# Patient Record
Sex: Female | Born: 1975 | Race: Black or African American | Hispanic: No | Marital: Married | Smoking: Never smoker
Health system: Southern US, Community
[De-identification: ages and names within clinical notes are randomized; demographics above are authoritative.]

## PROBLEM LIST (undated history)

## (undated) DIAGNOSIS — Z789 Other specified health status: Secondary | ICD-10-CM

## (undated) HISTORY — PX: NO PAST SURGERIES: SHX2092

## (undated) HISTORY — DX: Other specified health status: Z78.9

---

## 2015-04-04 ENCOUNTER — Other Ambulatory Visit: Payer: Self-pay

## 2015-04-04 DIAGNOSIS — Z3482 Encounter for supervision of other normal pregnancy, second trimester: Secondary | ICD-10-CM

## 2015-04-04 NOTE — Progress Notes (Signed)
NEW OB LABS DONE TODAY Lynn Lawrence 

## 2015-04-04 NOTE — Progress Notes (Signed)
Patient unable to void at today's lab visit so an OB urine culture was not collected, it will need to be done at her next OB visit.Lynn Lawrence, Rodena Medinobert Lee

## 2015-04-05 LAB — OBSTETRIC PANEL
Antibody Screen: NEGATIVE
Basophils Absolute: 0 10*3/uL (ref 0.0–0.1)
Basophils Relative: 0 % (ref 0–1)
EOS PCT: 1 % (ref 0–5)
Eosinophils Absolute: 0.1 10*3/uL (ref 0.0–0.7)
HCT: 33.9 % — ABNORMAL LOW (ref 36.0–46.0)
Hemoglobin: 10.8 g/dL — ABNORMAL LOW (ref 12.0–15.0)
Hepatitis B Surface Ag: NEGATIVE
LYMPHS PCT: 27 % (ref 12–46)
Lymphs Abs: 1.6 10*3/uL (ref 0.7–4.0)
MCH: 23.5 pg — AB (ref 26.0–34.0)
MCHC: 31.9 g/dL (ref 30.0–36.0)
MCV: 73.9 fL — ABNORMAL LOW (ref 78.0–100.0)
MONO ABS: 0.5 10*3/uL (ref 0.1–1.0)
Monocytes Relative: 9 % (ref 3–12)
Neutro Abs: 3.7 10*3/uL (ref 1.7–7.7)
Neutrophils Relative %: 63 % (ref 43–77)
Platelets: 191 10*3/uL (ref 150–400)
RBC: 4.59 MIL/uL (ref 3.87–5.11)
RDW: 16.8 % — ABNORMAL HIGH (ref 11.5–15.5)
Rh Type: POSITIVE
Rubella: 5.34 Index — ABNORMAL HIGH (ref <0.90)
WBC: 5.9 10*3/uL (ref 4.0–10.5)

## 2015-04-05 LAB — HIV ANTIBODY (ROUTINE TESTING W REFLEX): HIV 1&2 Ab, 4th Generation: NONREACTIVE

## 2015-04-05 LAB — SICKLE CELL SCREEN: Sickle Cell Screen: NEGATIVE

## 2015-04-11 ENCOUNTER — Encounter: Payer: Self-pay | Admitting: Family Medicine

## 2015-04-11 ENCOUNTER — Inpatient Hospital Stay (HOSPITAL_COMMUNITY): Admission: RE | Admit: 2015-04-11 | Payer: Self-pay | Source: Ambulatory Visit

## 2015-04-11 ENCOUNTER — Ambulatory Visit (INDEPENDENT_AMBULATORY_CARE_PROVIDER_SITE_OTHER): Payer: Self-pay | Admitting: Family Medicine

## 2015-04-11 VITALS — BP 101/70 | HR 95 | Temp 98.4°F | Wt 153.4 lb

## 2015-04-11 DIAGNOSIS — Z3491 Encounter for supervision of normal pregnancy, unspecified, first trimester: Secondary | ICD-10-CM

## 2015-04-11 DIAGNOSIS — Z331 Pregnant state, incidental: Secondary | ICD-10-CM

## 2015-04-11 LAB — POCT URINALYSIS DIPSTICK
Bilirubin, UA: NEGATIVE
Blood, UA: NEGATIVE
GLUCOSE UA: NEGATIVE
KETONES UA: NEGATIVE
Nitrite, UA: NEGATIVE
PH UA: 7
Protein, UA: NEGATIVE
Spec Grav, UA: 1.025
Urobilinogen, UA: 0.2

## 2015-04-11 LAB — OB RESULTS CONSOLE GC/CHLAMYDIA
CHLAMYDIA, DNA PROBE: NEGATIVE
Gonorrhea: NEGATIVE

## 2015-04-11 NOTE — Progress Notes (Signed)
Lynn Lawrence is a 4 G2P2012 presenting today for first OB visit. Recent immigrant from [place], speaks [place]. Reports last menstrual period in March-April 2016 but unsure exact date. Went to [facility] with positive pregnancy test at the end of June 2016. States they tested her urine and estimated date of delivery is in January 2017. Agrees to sign form so records from health department can be obtained.  Reports history of two vaginal deliveries at term without complication. Has two children, ages 36 and 17yo. Has had 7 total pregnancies. Five planned terminations. Two children were born in [place]. Reports no significant past medical history. Denies any significant family genetic history. Has never had pap smear before. Lives with her aunt and two children. No pets in home. No smokers in home. Reports occasional social drinking prior to pregnancy, but denies alcohol use while pregnant. Noted morning sickness at beginning of pregnancy, however this has improved. Has been taking PNV. No history of ultrasound. Not planned pregnancy, but is happy she is pregnant and has good relationship with father.  Previous labs reviewed and discussed.  Will obtain pap smear and gonorrhea/chlamydia screen today. Will obtain urinalysis and urine culture. Sign release to obtain records from health department. Will refer to high risk clinic for at least one visit due to advanced maternal age. Will obtain ultrasound for dating. Genetic counseling given advanced maternal age. Encouraged continued use of prenatal vitamins. Counseled on birth control--states she would like birth control after delivery, but unsure which form. Follow up in 4 weeks or sooner pending [place] results.

## 2015-04-11 NOTE — Patient Instructions (Signed)
First Trimester of Pregnancy The first trimester of pregnancy is from week 1 until the end of week 12 (months 1 through 3). A week after a sperm fertilizes an egg, the egg will implant on the wall of the uterus. This embryo will begin to develop into a baby. Genes from you and your partner are forming the baby. The female genes determine whether the baby is a boy or a girl. At 6-8 weeks, the eyes and face are formed, and the heartbeat can be seen on ultrasound. At the end of 12 weeks, all the baby's organs are formed.  Now that you are pregnant, you will want to do everything you can to have a healthy baby. Two of the most important things are to get good prenatal care and to follow your health care provider's instructions. Prenatal care is all the medical care you receive before the baby's birth. This care will help prevent, find, and treat any problems during the pregnancy and childbirth. BODY CHANGES Your body goes through many changes during pregnancy. The changes vary from woman to woman.   You may gain or lose a couple of pounds at first.  You may feel sick to your stomach (nauseous) and throw up (vomit). If the vomiting is uncontrollable, call your health care provider.  You may tire easily.  You may develop headaches that can be relieved by medicines approved by your health care provider.  You may urinate more often. Painful urination may mean you have a bladder infection.  You may develop heartburn as a result of your pregnancy.  You may develop constipation because certain hormones are causing the muscles that push waste through your intestines to slow down.  You may develop hemorrhoids or swollen, bulging veins (varicose veins).  Your breasts may begin to grow larger and become tender. Your nipples may stick out more, and the tissue that surrounds them (areola) may become darker.  Your gums may bleed and may be sensitive to brushing and flossing.  Dark spots or blotches (chloasma,  mask of pregnancy) may develop on your face. This will likely fade after the baby is born.  Your menstrual periods will stop.  You may have a loss of appetite.  You may develop cravings for certain kinds of food.  You may have changes in your emotions from day to day, such as being excited to be pregnant or being concerned that something may go wrong with the pregnancy and baby.  You may have more vivid and strange dreams.  You may have changes in your hair. These can include thickening of your hair, rapid growth, and changes in texture. Some women also have hair loss during or after pregnancy, or hair that feels dry or thin. Your hair will most likely return to normal after your baby is born. WHAT TO EXPECT AT YOUR PRENATAL VISITS During a routine prenatal visit:  You will be weighed to make sure you and the baby are growing normally.  Your blood pressure will be taken.  Your abdomen will be measured to track your baby's growth.  The fetal heartbeat will be listened to starting around week 10 or 12 of your pregnancy.  Test results from any previous visits will be discussed. Your health care provider may ask you:  How you are feeling.  If you are feeling the baby move.  If you have had any abnormal symptoms, such as leaking fluid, bleeding, severe headaches, or abdominal cramping.  If you have any questions. Other tests   that may be performed during your first trimester include:  Blood tests to find your blood type and to check for the presence of any previous infections. They will also be used to check for low iron levels (anemia) and Rh antibodies. Later in the pregnancy, blood tests for diabetes will be done along with other tests if problems develop.  Urine tests to check for infections, diabetes, or protein in the urine.  An ultrasound to confirm the proper growth and development of the baby.  An amniocentesis to check for possible genetic problems.  Fetal screens for  spina bifida and Down syndrome.  You may need other tests to make sure you and the baby are doing well. HOME CARE INSTRUCTIONS  Medicines  Follow your health care provider's instructions regarding medicine use. Specific medicines may be either safe or unsafe to take during pregnancy.  Take your prenatal vitamins as directed.  If you develop constipation, try taking a stool softener if your health care provider approves. Diet  Eat regular, well-balanced meals. Choose a variety of foods, such as meat or vegetable-based protein, fish, milk and low-fat dairy products, vegetables, fruits, and whole grain breads and cereals. Your health care provider will help you determine the amount of weight gain that is right for you.  Avoid raw meat and uncooked cheese. These carry germs that can cause birth defects in the baby.  Eating four or five small meals rather than three large meals a day may help relieve nausea and vomiting. If you start to feel nauseous, eating a few soda crackers can be helpful. Drinking liquids between meals instead of during meals also seems to help nausea and vomiting.  If you develop constipation, eat more high-fiber foods, such as fresh vegetables or fruit and whole grains. Drink enough fluids to keep your urine clear or pale yellow. Activity and Exercise  Exercise only as directed by your health care provider. Exercising will help you:  Control your weight.  Stay in shape.  Be prepared for labor and delivery.  Experiencing pain or cramping in the lower abdomen or low back is a good sign that you should stop exercising. Check with your health care provider before continuing normal exercises.  Try to avoid standing for long periods of time. Move your legs often if you must stand in one place for a long time.  Avoid heavy lifting.  Wear low-heeled shoes, and practice good posture.  You may continue to have sex unless your health care provider directs you  otherwise. Relief of Pain or Discomfort  Wear a good support bra for breast tenderness.   Take warm sitz baths to soothe any pain or discomfort caused by hemorrhoids. Use hemorrhoid cream if your health care provider approves.   Rest with your legs elevated if you have leg cramps or low back pain.  If you develop varicose veins in your legs, wear support hose. Elevate your feet for 15 minutes, 3-4 times a day. Limit salt in your diet. Prenatal Care  Schedule your prenatal visits by the twelfth week of pregnancy. They are usually scheduled monthly at first, then more often in the last 2 months before delivery.  Write down your questions. Take them to your prenatal visits.  Keep all your prenatal visits as directed by your health care provider. Safety  Wear your seat belt at all times when driving.  Make a list of emergency phone numbers, including numbers for family, friends, the hospital, and police and fire departments. General Tips    Ask your health care provider for a referral to a local prenatal education class. Begin classes no later than at the beginning of month 6 of your pregnancy.  Ask for help if you have counseling or nutritional needs during pregnancy. Your health care provider can offer advice or refer you to specialists for help with various needs.  Do not use hot tubs, steam rooms, or saunas.  Do not douche or use tampons or scented sanitary pads.  Do not cross your legs for long periods of time.  Avoid cat litter boxes and soil used by cats. These carry germs that can cause birth defects in the baby and possibly loss of the fetus by miscarriage or stillbirth.  Avoid all smoking, herbs, alcohol, and medicines not prescribed by your health care provider. Chemicals in these affect the formation and growth of the baby.  Schedule a dentist appointment. At home, brush your teeth with a soft toothbrush and be gentle when you floss. SEEK MEDICAL CARE IF:   You have  dizziness.  You have mild pelvic cramps, pelvic pressure, or nagging pain in the abdominal area.  You have persistent nausea, vomiting, or diarrhea.  You have a bad smelling vaginal discharge.  You have pain with urination.  You notice increased swelling in your face, hands, legs, or ankles. SEEK IMMEDIATE MEDICAL CARE IF:   You have a fever.  You are leaking fluid from your vagina.  You have spotting or bleeding from your vagina.  You have severe abdominal cramping or pain.  You have rapid weight gain or loss.  You vomit blood or material that looks like coffee grounds.  You are exposed to German measles and have never had them.  You are exposed to fifth disease or chickenpox.  You develop a severe headache.  You have shortness of breath.  You have any kind of trauma, such as from a fall or a car accident. Document Released: 08/27/2001 Document Revised: 01/17/2014 Document Reviewed: 07/13/2013 ExitCare Patient Information 2015 [employer]. This information is not intended to replace advice given to you by your health care provider. Make sure you discuss any questions you have with your health care provider. Medicines During Pregnancy During pregnancy, there are medicines that are either safe or unsafe to take. Medicines include prescriptions from your caregiver, over-the-counter medicines, topical creams applied to the skin, and all herbal substances. Medicines are put into either Class A, B, C, or D. Class A and B medicines have been shown to be safe in pregnancy. Class C medicines are also considered to be safe in pregnancy, but these medicines should only be used when necessary. Class D medicines should not be used at all in pregnancy. They can be harmful to a baby.  It is best to take as little medicine as possible while pregnant. However, some medicines are necessary to take for the mother and baby's health. Sometimes, it is more dangerous to stop taking certain  medicines than to stay on them. This is often the case for people with long-term (chronic) conditions such as asthma, diabetes, or high blood pressure (hypertension). If you are pregnant and have a chronic illness, call your caregiver right away. Bring a list of your medicines and their doses to your appointments. If you are planning to become pregnant, schedule a doctor's appointment and discuss your medicines with your caregiver. Lastly, write down the phone number to your pharmacist. They can answer questions regarding a medicine's class and safety. They cannot give advice as   to whether you should or should not be on a medicine.  SAFE AND UNSAFE MEDICINES There is a long list of medicines that are considered safe in pregnancy. Below is a shorter list. For specific medicines, ask your caregiver.  AllergyMedicines Loratadine, cetirizine, and chlorpheniramine are safe to take. Certain nasal steroid sprays are safe. Talk to your caregiver about specific brands that are safe. Analgesics Acetaminophen and acetaminophen with codeine are safe to take. All other nonsteroidal anti-inflammatory drugs (NSAIDS) are not safe. This includes ibuprofen.  Antacids Many over-the-counter antacids are safe to take. Talk to your caregiver about specific brands that are safe. Famotidine, ranitidine, and lansoprazole are safe. Omepresole is considered safe to take in the second trimester. Antibiotic Medicines There are several antibiotics to avoid. These include, but are not limited to, tetracyline, quinolones, and sulfa medications. Talk to your caregiver before taking any antibiotic.  Antihistamines Talk to your caregiver about specific brands that are safe.  Asthma Medicines Most asthma steroid inhalers are safe to take. Talk to your caregiver for specific details. Calcium Calcium supplements are safe to take. Do not take oyster shell calcium.  Cough and Cold Medicines It is safe to take products with  guaifenesin or dextromethorphan. Talk to your caregiver about specific brands that are safe. It is not safe to take products that contain aspirin or ibuprofen. Decongestant Medicines Pseudoephedrine-containing products are safe to take in the second and third trimester.  Depression Medicines Talk about these medicines with your caregiver.  Antidiarrheal Medicines It is safe to take loperamide. Talk to your caregiver about specific brands that are safe. It is not safe to take any antidiarrheal medicine that contains bismuth. Eyedrops Allergy eyedrops should be limited.  Iron It is safe to use certain iron-containing medicines for anemia in pregnancy. They require a prescription.  Antinausea Medicines It is safe to take doxylamine and vitamin B6 as directed. There are other prescription medicines available, if needed.  Sleep aids It is safe to take diphenhydramine and acetaminophen with diphenhydramine.  Steroids Hydrocortisone creams are safe to use as directed. Oral steroids require a prescription. It is not safe to take any hemorrhoid cream with pramoxine or phenylephrine. Stool softener It is safe to take stool softener medicines. Avoid daily or prolonged use of stool softeners. Thyroid Medicine It is important to stay on this thyroid medicine. It needs to be followed by your caregiver.  Vaginal Medicines Your caregiver will prescribe a medicine to you if you have a vaginal infection. Certain antifungal medicines are safe to use if you have a sexually transmitted infection (STI). Talk to your caregiver.  Document Released: 09/02/2005 Document Revised: 11/25/2011 Document Reviewed: 09/03/2011 ExitCare Patient Information 2015 [employer]. This information is not intended to replace advice given to you by your health care provider. Make sure you discuss any questions you have with your health care provider.  

## 2015-04-12 ENCOUNTER — Ambulatory Visit: Payer: Self-pay

## 2015-04-12 LAB — URINE CULTURE
Colony Count: NO GROWTH
Organism ID, Bacteria: NO GROWTH

## 2015-04-12 LAB — CERVICOVAGINAL ANCILLARY ONLY
CHLAMYDIA, DNA PROBE: NEGATIVE
Neisseria Gonorrhea: NEGATIVE

## 2015-04-12 LAB — CYTOLOGY - PAP

## 2015-04-19 ENCOUNTER — Ambulatory Visit (HOSPITAL_COMMUNITY)
Admission: RE | Admit: 2015-04-19 | Discharge: 2015-04-19 | Disposition: A | Discharge status: Home / Self Care | Payer: Medicaid Other | Source: Ambulatory Visit | Attending: Family Medicine | Admitting: Family Medicine

## 2015-04-19 ENCOUNTER — Encounter (HOSPITAL_COMMUNITY): Payer: Self-pay

## 2015-04-19 ENCOUNTER — Other Ambulatory Visit: Payer: Self-pay | Admitting: Family Medicine

## 2015-04-19 ENCOUNTER — Other Ambulatory Visit (HOSPITAL_COMMUNITY): Payer: Self-pay | Admitting: Maternal and Fetal Medicine

## 2015-04-19 DIAGNOSIS — IMO0002 Reserved for concepts with insufficient information to code with codable children: Secondary | ICD-10-CM

## 2015-04-19 DIAGNOSIS — Z3491 Encounter for supervision of normal pregnancy, unspecified, first trimester: Secondary | ICD-10-CM

## 2015-04-19 DIAGNOSIS — O09522 Supervision of elderly multigravida, second trimester: Secondary | ICD-10-CM

## 2015-04-19 DIAGNOSIS — Z0489 Encounter for examination and observation for other specified reasons: Secondary | ICD-10-CM

## 2015-04-19 DIAGNOSIS — Z315 Encounter for genetic counseling: Secondary | ICD-10-CM | POA: Insufficient documentation

## 2015-04-19 DIAGNOSIS — Z1389 Encounter for screening for other disorder: Secondary | ICD-10-CM

## 2015-04-19 DIAGNOSIS — Z3A16 16 weeks gestation of pregnancy: Secondary | ICD-10-CM | POA: Diagnosis not present

## 2015-04-20 DIAGNOSIS — O09529 Supervision of elderly multigravida, unspecified trimester: Secondary | ICD-10-CM | POA: Insufficient documentation

## 2015-04-20 LAB — QUAD SCREEN FOR MFM

## 2015-04-20 NOTE — Progress Notes (Addendum)
Genetic Counseling  High-Risk Gestation Note  Appointment Date:  04/19/15 Referred By: Araceli Bouche, DO Date of Birth:  [birth year]    Pregnancy History: [id number] Estimated Date of Delivery: 09/30/15 Estimated Gestational Age: [redacted]w[redacted]d Attending: Alpha Gula, MD  Lynn Lawrence was seen for genetic counseling because of a maternal age of 39 y.o..   Lynn Lawrence brother also attended the genetic counseling appointment today.  In summary:  Reviewed maternal age risks and the associated 1 in 52 risk for fetal aneuploidy  Patient elected to have Quad screening today  She declined NIPS (cost prohibitive)  She declined amniocentesis (concerned re: risk for complications)  Patient elected to have a detailed ultrasound today  There were no markers or anomalies visualized; however the anatomy was not well visualized secondary to the early gestational age  She is scheduled to return to complete the anatomy on 05/18/15  She was counseled regarding maternal age and the association with risk for chromosome conditions due to nondisjunction with aging of the ova.   We reviewed chromosomes, nondisjunction, and the associated 1 in 50 risk for fetal aneuploidy related to a maternal age of 39 y.o. at [redacted]w[redacted]d gestation.  She was counseled that the risk for aneuploidy decreases as gestational age increases, accounting for those pregnancies which spontaneously abort.  We specifically discussed Down syndrome (trisomy 42), trisomies 84 and 17, and sex chromosome aneuploidies (47,XXX and 47,XXY) including the common features and prognoses of each.   We reviewed available screening options including Quad screen, noninvasive prenatal screening (NIPS)/cell free DNA (cfDNA) testing, and detailed ultrasound.  She was counseled that screening tests are used to modify a patient's a priori risk for aneuploidy, typically based on age. This estimate provides a pregnancy specific risk assessment. We reviewed the  benefits and limitations of each option. Specifically, we discussed the conditions for which each test screens, the detection rates, and false positive rates of each. She was also counseled regarding diagnostic testing via amniocentesis. We reviewed the approximate 1 in 300-500 risk for complications for amniocentesis, including spontaneous pregnancy loss. After consideration of all the options, she elected to proceed with Quad screening. Those results will be available in ~3-5 business days.   The patient also expressed interest in having a detailed ultrasound.  A complete ultrasound was performed today. The ultrasound report will be documented separately. There were no visualized fetal anomalies or markers suggestive of aneuploidy; however, the fetal anatomy was not fully visualized due to the early gestational age. The patient is scheduled to return on 05/18/15 for a follow-up ultrasound. Amniocentesis and NIPS were declined today.  She understands that screening tests cannot rule out all birth defects or genetic syndromes. The patient was advised of this limitation and states she still does not want additional testing at this time.   Lynn Lawrence was provided with written information regarding sickle cell anemia (SCA) including the carrier frequency and incidence in the African population, the availability of carrier testing and prenatal diagnosis if indicated.  In addition, we discussed that hemoglobinopathies are routinely screened for as part of the [place] newborn screening panel.  She declined hemoglobin electrophoresis today. 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   Both family histories were reviewed and found to be noncontributory for birth defects, intellectual disability, and known genetic conditions. Without further information regarding the provided family history, an accurate genetic risk cannot be calculated. Further genetic counseling is  warranted if  more information is obtained.  Lynn Lawrence denied exposure to environmental toxins or chemical agents. She denied the use of alcohol, tobacco or street drugs. She denied significant viral illnesses during the course of her pregnancy. Her medical and surgical histories were contributory for 4 elective abortions.   I counseled this patient regarding the above risks and available options.  The approximate face-to-face time with the genetic counselor was 38 minutes.  Donald Prose, MS Certified Genetic Counselor

## 2015-04-21 ENCOUNTER — Telehealth: Payer: Self-pay | Admitting: Family Medicine

## 2015-04-21 NOTE — Telephone Encounter (Signed)
Patient would like to know if she needs to reschedule a month appointment. She'd already had an ultrasound last Wednesday. Please, follow up with Patient.

## 2015-04-21 NOTE — Telephone Encounter (Signed)
She still needs to come in every four weeks for OB visit. Ultrasound visit last week doesn't count, so she is next due for an appointment on 8/27.

## 2015-04-24 NOTE — Telephone Encounter (Signed)
LMOVM for pt to return call. Please inform her of the information below. Sunday Spillers, CMA

## 2015-04-28 ENCOUNTER — Other Ambulatory Visit (HOSPITAL_COMMUNITY): Payer: Self-pay | Admitting: Family Medicine

## 2015-05-04 ENCOUNTER — Encounter: Payer: Self-pay | Admitting: Advanced Practice Midwife

## 2015-05-04 ENCOUNTER — Ambulatory Visit (INDEPENDENT_AMBULATORY_CARE_PROVIDER_SITE_OTHER): Payer: Self-pay | Admitting: Advanced Practice Midwife

## 2015-05-04 VITALS — BP 112/70 | HR 92 | Temp 98.2°F | Ht 62.75 in | Wt 158.5 lb

## 2015-05-04 DIAGNOSIS — Z833 Family history of diabetes mellitus: Secondary | ICD-10-CM

## 2015-05-04 DIAGNOSIS — O09522 Supervision of elderly multigravida, second trimester: Secondary | ICD-10-CM

## 2015-05-04 LAB — POCT URINALYSIS DIP (DEVICE)
Bilirubin Urine: NEGATIVE
Glucose, UA: NEGATIVE mg/dL
Ketones, ur: NEGATIVE mg/dL
NITRITE: NEGATIVE
PH: 7 (ref 5.0–8.0)
Protein, ur: 30 mg/dL — AB
Specific Gravity, Urine: 1.025 (ref 1.005–1.030)
UROBILINOGEN UA: 0.2 mg/dL (ref 0.0–1.0)

## 2015-05-04 NOTE — Progress Notes (Signed)
Here for first visit. Given new patient education booklets.

## 2015-05-04 NOTE — Addendum Note (Signed)
Addended by: Gerome Apley on: 05/04/2015 02:26 PM   Modules accepted: Orders

## 2015-05-04 NOTE — Patient Instructions (Signed)
Second Trimester of Pregnancy The second trimester is from week 13 through week 28, months 4 through 6. The second trimester is often a time when you feel your best. Your body has also adjusted to being pregnant, and you begin to feel better physically. Usually, morning sickness has lessened or quit completely, you may have more energy, and you may have an increase in appetite. The second trimester is also a time when the fetus is growing rapidly. At the end of the sixth month, the fetus is about 9 inches long and weighs about 1 pounds. You will likely begin to feel the baby move (quickening) between 18 and 20 weeks of the pregnancy. BODY CHANGES Your body goes through many changes during pregnancy. The changes vary from woman to woman.   Your weight will continue to increase. You will notice your lower abdomen bulging out.  You may begin to get stretch marks on your hips, abdomen, and breasts.  You may develop headaches that can be relieved by medicines approved by your health care provider.  You may urinate more often because the fetus is pressing on your bladder.  You may develop or continue to have heartburn as a result of your pregnancy.  You may develop constipation because certain hormones are causing the muscles that push waste through your intestines to slow down.  You may develop hemorrhoids or swollen, bulging veins (varicose veins).  You may have back pain because of the weight gain and pregnancy hormones relaxing your joints between the bones in your pelvis and as a result of a shift in weight and the muscles that support your balance.  Your breasts will continue to grow and be tender.  Your gums may bleed and may be sensitive to brushing and flossing.  Dark spots or blotches (chloasma, mask of pregnancy) may develop on your face. This will likely fade after the baby is born.  A dark line from your belly button to the pubic area (linea nigra) may appear. This will likely fade  after the baby is born.  You may have changes in your hair. These can include thickening of your hair, rapid growth, and changes in texture. Some women also have hair loss during or after pregnancy, or hair that feels dry or thin. Your hair will most likely return to normal after your baby is born. WHAT TO EXPECT AT YOUR PRENATAL VISITS During a routine prenatal visit:  You will be weighed to make sure you and the fetus are growing normally.  Your blood pressure will be taken.  Your abdomen will be measured to track your baby's growth.  The fetal heartbeat will be listened to.  Any test results from the previous visit will be discussed. Your health care provider may ask you:  How you are feeling.  If you are feeling the baby move.  If you have had any abnormal symptoms, such as leaking fluid, bleeding, severe headaches, or abdominal cramping.  If you have any questions. Other tests that may be performed during your second trimester include:  Blood tests that check for:  Low iron levels (anemia).  Gestational diabetes (between 24 and 28 weeks).  Rh antibodies.  Urine tests to check for infections, diabetes, or protein in the urine.  An ultrasound to confirm the proper growth and development of the baby.  An amniocentesis to check for possible genetic problems.  Fetal screens for spina bifida and Down syndrome. HOME CARE INSTRUCTIONS   Avoid all smoking, herbs, alcohol, and unprescribed   drugs. These chemicals affect the formation and growth of the baby.  Follow your health care provider's instructions regarding medicine use. There are medicines that are either safe or unsafe to take during pregnancy.  Exercise only as directed by your health care provider. Experiencing uterine cramps is a good sign to stop exercising.  Continue to eat regular, healthy meals.  Wear a good support bra for breast tenderness.  Do not use hot tubs, steam rooms, or saunas.  Wear your  seat belt at all times when driving.  Avoid raw meat, uncooked cheese, cat litter boxes, and soil used by cats. These carry germs that can cause birth defects in the baby.  Take your prenatal vitamins.  Try taking a stool softener (if your health care provider approves) if you develop constipation. Eat more high-fiber foods, such as fresh vegetables or fruit and whole grains. Drink plenty of fluids to keep your urine clear or pale yellow.  Take warm sitz baths to soothe any pain or discomfort caused by hemorrhoids. Use hemorrhoid cream if your health care provider approves.  If you develop varicose veins, wear support hose. Elevate your feet for 15 minutes, 3-4 times a day. Limit salt in your diet.  Avoid heavy lifting, wear low heel shoes, and practice good posture.  Rest with your legs elevated if you have leg cramps or low back pain.  Visit your dentist if you have not gone yet during your pregnancy. Use a soft toothbrush to brush your teeth and be gentle when you floss.  A sexual relationship may be continued unless your health care provider directs you otherwise.  Continue to go to all your prenatal visits as directed by your health care provider. SEEK MEDICAL CARE IF:   You have dizziness.  You have mild pelvic cramps, pelvic pressure, or nagging pain in the abdominal area.  You have persistent nausea, vomiting, or diarrhea.  You have a bad smelling vaginal discharge.  You have pain with urination. SEEK IMMEDIATE MEDICAL CARE IF:   You have a fever.  You are leaking fluid from your vagina.  You have spotting or bleeding from your vagina.  You have severe abdominal cramping or pain.  You have rapid weight gain or loss.  You have shortness of breath with chest pain.  You notice sudden or extreme swelling of your face, hands, ankles, feet, or legs.  You have not felt your baby move in over an hour.  You have severe headaches that do not go away with  medicine.  You have vision changes. Document Released: 08/27/2001 Document Revised: 09/07/2013 Document Reviewed: 11/03/2012 Exit[employer] Patient Information 2015 [employer]. This information is not intended to replace advice given to you by your health care provider. Make sure you discuss any questions you have with your health care provider.  

## 2015-05-04 NOTE — Progress Notes (Signed)
    Subjective:    Lynn Lawrence is a [id number] [redacted]w[redacted]d being seen today for her first obstetrical visit.  Her obstetrical history is significant for advanced maternal age. Patient does intend to breast feed. Pregnancy history fully reviewed.  Patient reports no complaints.  Filed Vitals:   05/04/15 1300 05/04/15 1300  BP: 112/70   Pulse: 92   Temp: 98.2 F (36.8 C)   Height:  5' 2.75" (1.594 m)  Weight: 158 lb 8 oz (71.895 kg)     HISTORY: OB History  Gravida Para Term Preterm AB SAB TAB Ectopic Multiple Living  0 # Outcome Date GA Lbr Len/2nd Weight Sex Delivery Anes PTL Lv  6 Current           5 TAB 2014          4 TAB 2012          3 Term 10/18/08 [redacted]w[redacted]d  7 lb 11.5 oz (3.5 kg) M Vag-Spont None  Y     Comments: no complications, born in [place]  2 TAB 2009          1 Term 02/19/06 [redacted]w[redacted]d  7 lb 11.5 oz (3.5 kg) F Vag-Spont None  Y     Comments: no complications, born in [place]     History reviewed. No pertinent past medical history. History reviewed. No pertinent past surgical history. Family History  Problem Relation Age of Onset  . Diabetes Mother      Exam    Uterus:  Fundal Height: 19 cm  Pelvic Exam:    Perineum: Normal per new OB exam by Dr Caroleen Hamman   Vulva: See exam by Dr Caroleen Hamman   Vagina:  n/a   pH:    Cervix: normal per Dr Caroleen Hamman, long per [place]    Adnexa: not evaluated   Bony Pelvis: gynecoid  System: Breast:  normal appearance, no masses or tenderness   Skin: normal coloration and turgor, no rashes    Neurologic: oriented, grossly non-focal   Extremities: normal strength, tone, and muscle mass   HEENT neck supple with midline trachea   Mouth/Teeth mucous membranes moist, pharynx normal without lesions   Neck supple and no masses   Cardiovascular: regular rate and rhythm, no murmurs or gallops   Respiratory:  appears well, vitals normal, no respiratory distress, acyanotic, normal RR, ear and throat exam is normal, neck free of mass  or lymphadenopathy, chest clear, no wheezing, crepitations, rhonchi, normal symmetric air entry   Abdomen: soft, non-tender; bowel sounds normal; no masses,  no organomegaly   Urinary: n/a      Assessment:    Pregnancy: [id number] Patient Active Problem List   Diagnosis Date Noted  . Advanced maternal age in multigravida         Plan:     Initial labs drawn. Prenatal vitamins. Problem list reviewed and updated. Genetic Screening discussed Quad Screen: results reviewed. Normal/Negative  Ultrasound discussed; fetal survey: ordered. Normal but incomplete. Follow up is already scheduled  Follow up in 4 weeks. 50% of 30 min visit spent on counseling and coordination of care.   Routines reviewed. Discussed anticipated visits, testing and how to navigate the practice and where the MAU is and when to go there.   [facility] 05/04/2015

## 2015-05-05 LAB — PRESCRIPTION MONITORING PROFILE (19 PANEL)
AMPHETAMINE/METH: NEGATIVE ng/mL
BUPRENORPHINE, URINE: NEGATIVE ng/mL
Barbiturate Screen, Urine: NEGATIVE ng/mL
Benzodiazepine Screen, Urine: NEGATIVE ng/mL
CANNABINOID SCRN UR: NEGATIVE ng/mL
Carisoprodol, Urine: NEGATIVE ng/mL
Cocaine Metabolites: NEGATIVE ng/mL
Creatinine, Urine: 407.11 mg/dL (ref >20.0)
Fentanyl, Ur: NEGATIVE ng/mL
MDMA URINE: NEGATIVE ng/mL
METHAQUALONE SCREEN (URINE): NEGATIVE ng/mL
Meperidine, Ur: NEGATIVE ng/mL
Methadone Screen, Urine: NEGATIVE ng/mL
Nitrites, Initial: NEGATIVE ug/mL
OPIATE SCREEN, URINE: NEGATIVE ng/mL
Oxycodone Screen, Ur: NEGATIVE ng/mL
PHENCYCLIDINE, UR: NEGATIVE ng/mL
Propoxyphene: NEGATIVE ng/mL
Tapentadol, urine: NEGATIVE ng/mL
Tramadol Scrn, Ur: NEGATIVE ng/mL
Zolpidem, Urine: NEGATIVE ng/mL
pH, Initial: 7.5 pH (ref 4.5–8.9)

## 2015-05-05 LAB — GLUCOSE TOLERANCE, 1 HOUR (50G) W/O FASTING: Glucose, 1 Hour GTT: 102 mg/dL (ref 70–140)

## 2015-05-18 ENCOUNTER — Ambulatory Visit (HOSPITAL_COMMUNITY)
Admission: RE | Admit: 2015-05-18 | Discharge: 2015-05-18 | Disposition: A | Discharge status: Home / Self Care | Payer: Self-pay | Source: Ambulatory Visit | Attending: Family Medicine | Admitting: Family Medicine

## 2015-05-18 VITALS — BP 106/65 | HR 92 | Wt 162.0 lb

## 2015-05-18 DIAGNOSIS — O09522 Supervision of elderly multigravida, second trimester: Secondary | ICD-10-CM | POA: Insufficient documentation

## 2015-05-18 DIAGNOSIS — Z36 Encounter for antenatal screening of mother: Secondary | ICD-10-CM | POA: Insufficient documentation

## 2015-05-18 DIAGNOSIS — IMO0002 Reserved for concepts with insufficient information to code with codable children: Secondary | ICD-10-CM

## 2015-05-18 DIAGNOSIS — Z0489 Encounter for examination and observation for other specified reasons: Secondary | ICD-10-CM

## 2015-05-18 DIAGNOSIS — O09529 Supervision of elderly multigravida, unspecified trimester: Secondary | ICD-10-CM

## 2015-06-05 ENCOUNTER — Ambulatory Visit (INDEPENDENT_AMBULATORY_CARE_PROVIDER_SITE_OTHER): Payer: Self-pay | Admitting: Obstetrics & Gynecology

## 2015-06-05 VITALS — BP 108/59 | HR 83 | Temp 98.2°F | Wt 160.6 lb

## 2015-06-05 DIAGNOSIS — O09522 Supervision of elderly multigravida, second trimester: Secondary | ICD-10-CM

## 2015-06-05 LAB — POCT URINALYSIS DIP (DEVICE)
BILIRUBIN URINE: NEGATIVE
GLUCOSE, UA: NEGATIVE mg/dL
Hgb urine dipstick: NEGATIVE
KETONES UR: NEGATIVE mg/dL
Nitrite: NEGATIVE
Protein, ur: NEGATIVE mg/dL
SPECIFIC GRAVITY, URINE: 1.025 (ref 1.005–1.030)
Urobilinogen, UA: 0.2 mg/dL (ref 0.0–1.0)
pH: 5.5 (ref 5.0–8.0)

## 2015-06-05 NOTE — Patient Instructions (Signed)
Return to clinic for any obstetric concerns or go to MAU for evaluation  

## 2015-06-05 NOTE — Progress Notes (Signed)
Subjective:  Lynn Lawrence is a 39 y.o. [id number] at [redacted]w[redacted]d being seen today for ongoing prenatal care.  Patient reports fatigue and occasional cramping.  Contractions: Not present.  Vag. Bleeding: None. Movement: Present. Denies leaking of fluid.   The following portions of the patient's history were reviewed and updated as appropriate: allergies, current medications, past family history, past medical history, past social history, past surgical history and problem list.   Objective:   Filed Vitals:   06/05/15 0838  BP: 108/59  Pulse: 83  Temp: 98.2 F (36.8 C)  Weight: 160 lb 9.6 oz (72.848 kg)    Fetal Status: Fetal Heart Rate (bpm): 147 Fundal Height: 23 cm Movement: Present     General:  Alert, oriented and cooperative. Patient is in no acute distress.  Skin: Skin is warm and dry. No rash noted.   Cardiovascular: Normal heart rate noted  Respiratory: Normal respiratory effort, no problems with respiration noted  Abdomen: Soft, gravid, appropriate for gestational age. Pain/Pressure: Present     Pelvic: Vag. Bleeding: None    Cervical exam deferred        Extremities: Normal range of motion.  Edema: None  Mental Status: Normal mood and affect. Normal behavior. Normal judgment and thought content.   Urinalysis: Urine Protein: Negative Urine Glucose: Negative  Assessment and Plan:  Pregnancy: [id number] at [redacted]w[redacted]d  Advanced maternal age in multigravida, second trimester Will follow up incomplete anatomy scan; next scan is on 07/13/15. Preterm labor symptoms and general obstetric precautions including but not limited to vaginal bleeding, contractions, leaking of fluid and fetal movement were reviewed in detail with the patient. Please refer to After Visit Summary for other counseling recommendations.  Return in about 4 weeks (around 07/03/2015) for OB Visit, 3rd trimester labs, 1 hr GTT.   Tereso Newcomer, MD

## 2015-06-05 NOTE — Progress Notes (Signed)
Pt reports dizziness; generalized pain Educated pt on Benefits of Breastfeeding for bab

## 2015-07-03 ENCOUNTER — Ambulatory Visit (INDEPENDENT_AMBULATORY_CARE_PROVIDER_SITE_OTHER): Payer: Medicaid Other | Admitting: Obstetrics & Gynecology

## 2015-07-03 VITALS — BP 116/68 | HR 101 | Temp 98.8°F | Wt 168.5 lb

## 2015-07-03 DIAGNOSIS — Z23 Encounter for immunization: Secondary | ICD-10-CM | POA: Diagnosis not present

## 2015-07-03 DIAGNOSIS — O09529 Supervision of elderly multigravida, unspecified trimester: Secondary | ICD-10-CM

## 2015-07-03 LAB — CBC
HEMATOCRIT: 31.5 % — AB (ref 36.0–46.0)
Hemoglobin: 10 g/dL — ABNORMAL LOW (ref 12.0–15.0)
MCH: 23.6 pg — AB (ref 26.0–34.0)
MCHC: 31.7 g/dL (ref 30.0–36.0)
MCV: 74.5 fL — ABNORMAL LOW (ref 78.0–100.0)
PLATELETS: 171 10*3/uL (ref 150–400)
RBC: 4.23 MIL/uL (ref 3.87–5.11)
RDW: 15.8 % — AB (ref 11.5–15.5)
WBC: 8.6 10*3/uL (ref 4.0–10.5)

## 2015-07-03 LAB — POCT URINALYSIS DIP (DEVICE)
Bilirubin Urine: NEGATIVE
Glucose, UA: NEGATIVE mg/dL
Hgb urine dipstick: NEGATIVE
Ketones, ur: NEGATIVE mg/dL
NITRITE: NEGATIVE
PH: 6 (ref 5.0–8.0)
Protein, ur: NEGATIVE mg/dL
SPECIFIC GRAVITY, URINE: 1.025 (ref 1.005–1.030)
Urobilinogen, UA: 0.2 mg/dL (ref 0.0–1.0)

## 2015-07-03 MED ORDER — TETANUS-DIPHTH-ACELL PERTUSSIS 5-2.5-18.5 LF-MCG/0.5 IM SUSP
0.5000 mL | Freq: Once | INTRAMUSCULAR | Status: AC
  Administered 2015-07-03: 0.5 mL via INTRAMUSCULAR

## 2015-07-03 NOTE — Progress Notes (Signed)
Subjective:  Emilia BeckOreoluwa Tyer is a 39 y.o. [id number] at 1462w2d being seen today for ongoing prenatal care.  Patient reports headache.  Pain is across the entire forehead.  No scotomata, light or sound sensitivity, no HTN, Relfexes nml, no proteinuria.  Pt should take Tylenol for headache, cool compresses.  Contractions: Not present.  Vag. Bleeding: None. Movement: Present. Denies leaking of fluid.   The following portions of the patient's history were reviewed and updated as appropriate: allergies, current medications, past family history, past medical history, past social history, past surgical history and problem list. Problem list updated.  Objective:   Filed Vitals:   07/03/15 0958  BP: 116/68  Pulse: 101  Temp: 98.8 F (37.1 C)  Weight: 168 lb 8 oz (76.431 kg)    Fetal Status: Fetal Heart Rate (bpm): 148 Fundal Height: 27 cm Movement: Present     General:  Alert, oriented and cooperative. Patient is in no acute distress.  Skin: Skin is warm and dry. No rash noted.   Cardiovascular: Normal heart rate noted  Respiratory: Normal respiratory effort, no problems with respiration noted  Abdomen: Soft, gravid, appropriate for gestational age. Pain/Pressure: Present     Pelvic: Vag. Bleeding: None     Cervical exam deferred        Extremities: Normal range of motion.  Edema: None  Mental Status: Normal mood and affect. Normal behavior. Normal judgment and thought content.   Urinalysis: Urine Protein: Negative Urine Glucose: Negative  Assessment and Plan:  Pregnancy: [id number] at 8562w2d  1. Need for Tdap vaccination - Flu shot - Tdap (BOOSTRIX) injection 0.5 mL; Inject 0.5 mLs into the muscle once.  2. Advanced maternal age in multigravida, unspecified trimester -start AMA testing  - Flu Vaccine QUAD 36+ mos IM; Standing - Flu Vaccine QUAD 36+ mos IM  3. Needs flu shot - Glucose Tolerance, 1 HR (50g) w/o Fasting - CBC - RPR - HIV antibody (with reflex)  Preterm labor symptoms  and general obstetric precautions including but not limited to vaginal bleeding, contractions, leaking of fluid and fetal movement were reviewed in detail with the patient. Please refer to After Visit Summary for other counseling recommendations.  Return in about 2 weeks (around 07/17/2015).   Lesly DukesKelly H Leggett, MD

## 2015-07-03 NOTE — Progress Notes (Signed)
Headaches today. 28 week labs and tdap. Pt has large leukocytes in her urine.

## 2015-07-03 NOTE — Assessment & Plan Note (Signed)
Start testing at 36 weeks with induction at 40 weeks.

## 2015-07-04 ENCOUNTER — Telehealth: Payer: Self-pay | Admitting: *Deleted

## 2015-07-04 LAB — GLUCOSE TOLERANCE, 1 HOUR (50G) W/O FASTING: Glucose, 1 Hour GTT: 106 mg/dL (ref 70–140)

## 2015-07-04 LAB — HIV ANTIBODY (ROUTINE TESTING W REFLEX): HIV 1&2 Ab, 4th Generation: NONREACTIVE

## 2015-07-04 LAB — RPR

## 2015-07-04 NOTE — Telephone Encounter (Signed)
Called patient and informed her to start taking iron 325mg  daily, also to take colace if needed. Patient had no further questions.

## 2015-07-06 ENCOUNTER — Encounter: Payer: Self-pay | Admitting: Obstetrics & Gynecology

## 2015-07-06 DIAGNOSIS — D649 Anemia, unspecified: Secondary | ICD-10-CM | POA: Insufficient documentation

## 2015-07-13 ENCOUNTER — Other Ambulatory Visit (HOSPITAL_COMMUNITY): Payer: Self-pay | Admitting: Maternal and Fetal Medicine

## 2015-07-13 ENCOUNTER — Encounter (HOSPITAL_COMMUNITY): Payer: Self-pay

## 2015-07-13 ENCOUNTER — Ambulatory Visit (HOSPITAL_COMMUNITY)
Admission: RE | Admit: 2015-07-13 | Discharge: 2015-07-13 | Disposition: A | Discharge status: Home / Self Care | Payer: Medicaid Other | Source: Ambulatory Visit | Attending: Family Medicine | Admitting: Family Medicine

## 2015-07-13 DIAGNOSIS — O09529 Supervision of elderly multigravida, unspecified trimester: Secondary | ICD-10-CM | POA: Insufficient documentation

## 2015-07-13 DIAGNOSIS — Z3A28 28 weeks gestation of pregnancy: Secondary | ICD-10-CM

## 2015-07-17 ENCOUNTER — Encounter: Payer: Self-pay | Admitting: Obstetrics and Gynecology

## 2015-07-17 ENCOUNTER — Ambulatory Visit (INDEPENDENT_AMBULATORY_CARE_PROVIDER_SITE_OTHER): Payer: Medicaid Other | Admitting: Obstetrics and Gynecology

## 2015-07-17 VITALS — BP 118/70 | HR 119 | Temp 98.5°F | Wt 171.3 lb

## 2015-07-17 DIAGNOSIS — O09523 Supervision of elderly multigravida, third trimester: Secondary | ICD-10-CM

## 2015-07-17 LAB — POCT URINALYSIS DIP (DEVICE)
BILIRUBIN URINE: NEGATIVE
Glucose, UA: NEGATIVE mg/dL
HGB URINE DIPSTICK: NEGATIVE
Ketones, ur: NEGATIVE mg/dL
NITRITE: NEGATIVE
PH: 7 (ref 5.0–8.0)
PROTEIN: NEGATIVE mg/dL
Specific Gravity, Urine: 1.02 (ref 1.005–1.030)
Urobilinogen, UA: 0.2 mg/dL (ref 0.0–1.0)

## 2015-07-17 MED ORDER — PRENATAL VITAMIN 27-0.8 MG PO TABS: 1.0000 | ORAL_TABLET | Freq: Every day | ORAL | Status: AC

## 2015-07-17 NOTE — Progress Notes (Signed)
Breasfeeding tip of the week reviewed Urine: small amt wbcs

## 2015-07-17 NOTE — Progress Notes (Signed)
Subjective:  Lynn BeckOreoluwa Lawrence is a 39 y.o. [id number] at 4494w2d being seen today for ongoing prenatal care.  Patient reports no complaints.  Contractions: Not present.  Vag. Bleeding: None. Movement: Present. Denies leaking of fluid.   The following portions of the patient's history were reviewed and updated as appropriate: allergies, current medications, past family history, past medical history, past social history, past surgical history and problem list. Problem list updated.  Objective:   Filed Vitals:   07/17/15 1011  BP: 118/70  Pulse: 119  Temp: 98.5 F (36.9 C)  Weight: 171 lb 4.8 oz (77.701 kg)    Fetal Status: Fetal Heart Rate (bpm): 140   Movement: Present     General:  Alert, oriented and cooperative. Patient is in no acute distress.  Skin: Skin is warm and dry. No rash noted.   Cardiovascular: Normal heart rate noted  Respiratory: Normal respiratory effort, no problems with respiration noted  Abdomen: Soft, gravid, appropriate for gestational age. Pain/Pressure: Present     Pelvic: Vag. Bleeding: None     Cervical exam deferred        Extremities: Normal range of motion.  Edema: Trace  Mental Status: Normal mood and affect. Normal behavior. Normal judgment and thought content.   Urinalysis: Urine Protein: Negative Urine Glucose: Negative  Assessment and Plan:  Pregnancy: [id number] at 894w2d  1. Advanced maternal age in multigravida, third trimester Patient is doing well without complaints 1 hr glucola results reviewed PNV refill provided  Preterm labor symptoms and general obstetric precautions including but not limited to vaginal bleeding, contractions, leaking of fluid and fetal movement were reviewed in detail with the patient. Please refer to After Visit Summary for other counseling recommendations.  Return in about 2 weeks (around 07/31/2015).   Catalina AntiguaPeggy Amreen Raczkowski, MD

## 2015-07-24 ENCOUNTER — Encounter: Payer: Self-pay | Admitting: Obstetrics & Gynecology

## 2015-07-24 ENCOUNTER — Ambulatory Visit (INDEPENDENT_AMBULATORY_CARE_PROVIDER_SITE_OTHER): Payer: Medicaid Other | Admitting: Obstetrics & Gynecology

## 2015-07-24 VITALS — BP 123/66 | HR 101 | Wt 173.1 lb

## 2015-07-24 DIAGNOSIS — O09523 Supervision of elderly multigravida, third trimester: Secondary | ICD-10-CM | POA: Diagnosis present

## 2015-07-24 LAB — POCT URINALYSIS DIP (DEVICE)
Bilirubin Urine: NEGATIVE
GLUCOSE, UA: NEGATIVE mg/dL
Hgb urine dipstick: NEGATIVE
Ketones, ur: NEGATIVE mg/dL
NITRITE: NEGATIVE
Protein, ur: 30 mg/dL — AB
Specific Gravity, Urine: 1.02 (ref 1.005–1.030)
UROBILINOGEN UA: 0.2 mg/dL (ref 0.0–1.0)
pH: 7.5 (ref 5.0–8.0)

## 2015-07-24 NOTE — Progress Notes (Signed)
Subjective:  Lynn Lawrence is a 39 y.o. [id number] at 1721w2d being seen today for ongoing prenatal care.  Patient reports no complaints.  Contractions: Not present.  Vag. Bleeding: None. Movement: Present. Denies leaking of fluid.   The following portions of the patient's history were reviewed and updated as appropriate: allergies, current medications, past family history, past medical history, past social history, past surgical history and problem list. Problem list updated.  Objective:   Filed Vitals:   07/24/15 1035  BP: 123/66  Pulse: 101  Weight: 173 lb 1.6 oz (78.518 kg)    Fetal Status:     Movement: Present     General:  Alert, oriented and cooperative. Patient is in no acute distress.  Skin: Skin is warm and dry. No rash noted.   Cardiovascular: Normal heart rate noted  Respiratory: Normal respiratory effort, no problems with respiration noted  Abdomen: Soft, gravid, appropriate for gestational age. Pain/Pressure: Absent     Pelvic: Vag. Bleeding: None     Cervical exam deferred        Extremities: Normal range of motion.  Edema: Trace  Mental Status: Normal mood and affect. Normal behavior. Normal judgment and thought content.   Urinalysis: Urine Protein: 1+ Urine Glucose: Negative  Assessment and Plan:  Pregnancy: [id number] at 3921w2d  There are no diagnoses linked to this encounter. Preterm labor symptoms and general obstetric precautions including but not limited to vaginal bleeding, contractions, leaking of fluid and fetal movement were reviewed in detail with the patient. Please refer to After Visit Summary for other counseling recommendations.  Return in about 2 weeks (around 08/07/2015).   Lesly DukesKelly H Leggett, MD

## 2015-08-07 ENCOUNTER — Ambulatory Visit (INDEPENDENT_AMBULATORY_CARE_PROVIDER_SITE_OTHER): Payer: Medicaid Other | Admitting: Obstetrics and Gynecology

## 2015-08-07 VITALS — BP 121/66 | HR 105 | Temp 98.1°F | Wt 176.3 lb

## 2015-08-07 DIAGNOSIS — O09523 Supervision of elderly multigravida, third trimester: Secondary | ICD-10-CM

## 2015-08-07 DIAGNOSIS — O09529 Supervision of elderly multigravida, unspecified trimester: Secondary | ICD-10-CM

## 2015-08-07 LAB — POCT URINALYSIS DIP (DEVICE)
Bilirubin Urine: NEGATIVE
Glucose, UA: NEGATIVE mg/dL
Hgb urine dipstick: NEGATIVE
Ketones, ur: NEGATIVE mg/dL
Nitrite: NEGATIVE
PROTEIN: 30 mg/dL — AB
Specific Gravity, Urine: 1.02 (ref 1.005–1.030)
UROBILINOGEN UA: 0.2 mg/dL (ref 0.0–1.0)
pH: 7 (ref 5.0–8.0)

## 2015-08-07 NOTE — Progress Notes (Signed)
Breastfeeding tip of the week reviewed. 

## 2015-08-07 NOTE — Progress Notes (Signed)
Growth U/S 08/08/15 @10am .

## 2015-08-07 NOTE — Progress Notes (Signed)
Subjective:  Emilia BeckOreoluwa Wien is a 39 y.o. [id number] at 11069w2d being seen today for ongoing prenatal care.  She is currently monitored for the following issues for this high-risk pregnancy: Patient Active Problem List   Diagnosis Date Noted  . Anemia 07/06/2015  . Advanced maternal age in multigravida    Patient reports no complaints.  Contractions: Not present. Vag. Bleeding: None.  Movement: Present. Denies leaking of fluid.   The following portions of the patient's history were reviewed and updated as appropriate: allergies, current medications, past family history, past medical history, past social history, past surgical history and problem list. Problem list updated.  Objective:   Filed Vitals:   08/07/15 1003  BP: 121/66  Pulse: 105  Temp: 98.1 F (36.7 C)  Weight: 176 lb 4.8 oz (79.969 kg)    Fetal Status: Fetal Heart Rate (bpm): 150 Fundal Height: 33 cm Movement: Present     General:  Alert, oriented and cooperative. Patient is in no acute distress.  Skin: Skin is warm and dry. No rash noted.   Cardiovascular: Normal heart rate noted  Respiratory: Normal respiratory effort, no problems with respiration noted  Abdomen: Soft, gravid, appropriate for gestational age. Pain/Pressure: Present     Pelvic: Vag. Bleeding: None     Cervical exam deferred        Extremities: Normal range of motion.  Edema: Trace  Mental Status: Normal mood and affect. Normal behavior. Normal judgment and thought content.   Urinalysis: Urine Protein: 1+ Urine Glucose: Negative  Assessment and Plan:  Pregnancy: [id number] at 5369w2d  # AMA - scheduling growth u/s today - start antenatal testing @ 36, iol @ 40  # Anemia - on iron, repeating CBC today    Preterm labor symptoms and general obstetric precautions including but not limited to vaginal bleeding, contractions, leaking of fluid and fetal movement were reviewed in detail with the patient. Please refer to After Visit Summary for other  counseling recommendations.  Return in about 2 weeks (around 08/21/2015).   Kathrynn RunningNoah Bedford Wouk, MD

## 2015-08-08 ENCOUNTER — Telehealth: Payer: Self-pay | Admitting: *Deleted

## 2015-08-08 ENCOUNTER — Ambulatory Visit (HOSPITAL_COMMUNITY)
Admission: RE | Admit: 2015-08-08 | Discharge: 2015-08-08 | Disposition: A | Discharge status: Home / Self Care | Payer: Medicaid Other | Source: Ambulatory Visit | Attending: Obstetrics and Gynecology | Admitting: Obstetrics and Gynecology

## 2015-08-08 DIAGNOSIS — Z3A32 32 weeks gestation of pregnancy: Secondary | ICD-10-CM | POA: Insufficient documentation

## 2015-08-08 DIAGNOSIS — O09529 Supervision of elderly multigravida, unspecified trimester: Secondary | ICD-10-CM

## 2015-08-08 DIAGNOSIS — O09523 Supervision of elderly multigravida, third trimester: Secondary | ICD-10-CM | POA: Diagnosis not present

## 2015-08-08 LAB — CBC
HCT: 30.5 % — ABNORMAL LOW (ref 36.0–46.0)
Hemoglobin: 9.9 g/dL — ABNORMAL LOW (ref 12.0–15.0)
MCH: 23.7 pg — ABNORMAL LOW (ref 26.0–34.0)
MCHC: 32.5 g/dL (ref 30.0–36.0)
MCV: 73.1 fL — ABNORMAL LOW (ref 78.0–100.0)
Platelets: 150 10*3/uL (ref 150–400)
RBC: 4.17 MIL/uL (ref 3.87–5.11)
RDW: 15.2 % (ref 11.5–15.5)
WBC: 7.2 10*3/uL (ref 4.0–10.5)

## 2015-08-08 NOTE — Telephone Encounter (Signed)
Called pt and informed her of test result showing that her anemia is unchanged. Although it is mild, she needs to continue taking the iron supplement as directed. Pt voiced understanding.

## 2015-08-21 ENCOUNTER — Encounter: Payer: Medicaid Other | Admitting: Obstetrics and Gynecology

## 2015-08-21 ENCOUNTER — Ambulatory Visit (INDEPENDENT_AMBULATORY_CARE_PROVIDER_SITE_OTHER): Payer: Medicaid Other | Admitting: Obstetrics and Gynecology

## 2015-08-21 VITALS — BP 118/77 | HR 95 | Temp 98.4°F | Wt 178.4 lb

## 2015-08-21 DIAGNOSIS — D649 Anemia, unspecified: Secondary | ICD-10-CM

## 2015-08-21 DIAGNOSIS — O99013 Anemia complicating pregnancy, third trimester: Secondary | ICD-10-CM | POA: Diagnosis not present

## 2015-08-21 DIAGNOSIS — O99019 Anemia complicating pregnancy, unspecified trimester: Secondary | ICD-10-CM

## 2015-08-21 LAB — CBC
HCT: 31.4 % — ABNORMAL LOW (ref 36.0–46.0)
Hemoglobin: 10.5 g/dL — ABNORMAL LOW (ref 12.0–15.0)
MCH: 24 pg — AB (ref 26.0–34.0)
MCHC: 33.4 g/dL (ref 30.0–36.0)
MCV: 71.9 fL — ABNORMAL LOW (ref 78.0–100.0)
Platelets: 159 10*3/uL (ref 150–400)
RBC: 4.37 MIL/uL (ref 3.87–5.11)
RDW: 15.1 % (ref 11.5–15.5)
WBC: 7.2 10*3/uL (ref 4.0–10.5)

## 2015-08-21 LAB — POCT URINALYSIS DIP (DEVICE)
BILIRUBIN URINE: NEGATIVE
Glucose, UA: NEGATIVE mg/dL
Hgb urine dipstick: NEGATIVE
Ketones, ur: NEGATIVE mg/dL
NITRITE: NEGATIVE
PH: 6 (ref 5.0–8.0)
PROTEIN: 30 mg/dL — AB
Specific Gravity, Urine: 1.025 (ref 1.005–1.030)
UROBILINOGEN UA: 0.2 mg/dL (ref 0.0–1.0)

## 2015-08-21 NOTE — Progress Notes (Signed)
Breastfeeding tip of the week reviewed. 

## 2015-08-21 NOTE — Progress Notes (Signed)
Growth U/S 09/04/15 @ 1030a.

## 2015-08-21 NOTE — Progress Notes (Deleted)
Subjective:  Lynn Lawrence is a 39 y.o. [id number] at 738w2d being seen today for ongoing prenatal care.  She is currently monitored for the following issues for this {Blank single:19197::"high-risk","low-risk"} pregnancy and has Advanced maternal age in multigravida and Anemia on her problem list.  Patient reports {sx:[id number]}.  Contractions: Not present. Vag. Bleeding: None.  Movement: Present. Denies leaking of fluid.   The following portions of the patient's history were reviewed and updated as appropriate: allergies, current medications, past family history, past medical history, past social history, past surgical history and problem list. Problem list updated.  Objective:   Filed Vitals:   08/21/15 1049  BP: 118/77  Pulse: 95  Temp: 98.4 F (36.9 C)  Weight: 178 lb 6.4 oz (80.922 kg)    Fetal Status: Fetal Heart Rate (bpm): 140   Movement: Present     General:  Alert, oriented and cooperative. Patient is in no acute distress.  Skin: Skin is warm and dry. No rash noted.   Cardiovascular: Normal heart rate noted  Respiratory: Normal respiratory effort, no problems with respiration noted  Abdomen: Soft, gravid, appropriate for gestational age. Pain/Pressure: Present     Pelvic: Vag. Bleeding: None     {Blank single:19197::"Cervical exam performed","Cervical exam deferred"}        Extremities: Normal range of motion.  Edema: None  Mental Status: Normal mood and affect. Normal behavior. Normal judgment and thought content.   Urinalysis:      Assessment and Plan:  Pregnancy: [id number] at 468w2d  There are no diagnoses linked to this encounter. {Blank single:19197::"Term","Preterm"} labor symptoms and general obstetric precautions including but not limited to vaginal bleeding, contractions, leaking of fluid and fetal movement were reviewed in detail with the patient. Please refer to After Visit Summary for other counseling recommendations.  No Follow-up on file.   Kathrynn RunningNoah Bedford  [name], MD

## 2015-08-21 NOTE — Progress Notes (Signed)
Subjective:  Lynn Lawrence is a 39 y.o. [id number] at 1545w2d being seen today for ongoing prenatal care.  She is currently monitored for the following issues for this high-risk pregnancy and has Advanced maternal age in multigravida and Anemia on her problem list.  Patient reports no complaints.  Contractions: Not present. Vag. Bleeding: None.  Movement: Present. Denies leaking of fluid.   The following portions of the patient's history were reviewed and updated as appropriate: allergies, current medications, past family history, past medical history, past social history, past surgical history and problem list. Problem list updated.  Objective:   Filed Vitals:   08/21/15 1049  BP: 118/77  Pulse: 95  Temp: 98.4 F (36.9 C)  Weight: 178 lb 6.4 oz (80.922 kg)    Fetal Status: Fetal Heart Rate (bpm): 140 Fundal Height: 33 cm Movement: Present     General:  Alert, oriented and cooperative. Patient is in no acute distress.  Skin: Skin is warm and dry. No rash noted.   Cardiovascular: Normal heart rate noted  Respiratory: Normal respiratory effort, no problems with respiration noted  Abdomen: Soft, gravid, appropriate for gestational age. Pain/Pressure: Present     Pelvic: Vag. Bleeding: None     Cervical exam deferred        Extremities: Normal range of motion.  Edema: None  Mental Status: Normal mood and affect. Normal behavior. Normal judgment and thought content.   Urinalysis:      Assessment and Plan:  Pregnancy: [id number] at 6545w2d  1. Anemia affecting pregnancy - CBC  # AMA - growth scan ordering today for 36 weeks - begin antenatal testing 36 weeks  Preterm labor symptoms and general obstetric precautions including but not limited to vaginal bleeding, contractions, leaking of fluid and fetal movement were reviewed in detail with the patient. Please refer to After Visit Summary for other counseling recommendations.  F/u 2 weeks   Kathrynn RunningNoah Bedford Lashina Milles, MD

## 2015-09-04 ENCOUNTER — Encounter: Payer: Self-pay | Admitting: Family Medicine

## 2015-09-04 ENCOUNTER — Other Ambulatory Visit: Payer: Self-pay | Admitting: Family Medicine

## 2015-09-04 ENCOUNTER — Other Ambulatory Visit (HOSPITAL_COMMUNITY)
Admission: RE | Admit: 2015-09-04 | Discharge: 2015-09-04 | Disposition: A | Discharge status: Home / Self Care | Payer: Medicaid Other | Source: Ambulatory Visit | Attending: Family Medicine | Admitting: Family Medicine

## 2015-09-04 ENCOUNTER — Ambulatory Visit (INDEPENDENT_AMBULATORY_CARE_PROVIDER_SITE_OTHER): Payer: Medicaid Other | Admitting: Family Medicine

## 2015-09-04 ENCOUNTER — Encounter (HOSPITAL_COMMUNITY): Payer: Self-pay

## 2015-09-04 ENCOUNTER — Ambulatory Visit (HOSPITAL_COMMUNITY)
Admission: RE | Admit: 2015-09-04 | Discharge: 2015-09-04 | Disposition: A | Discharge status: Home / Self Care | Payer: Medicaid Other | Source: Ambulatory Visit | Attending: Obstetrics and Gynecology | Admitting: Obstetrics and Gynecology

## 2015-09-04 VITALS — BP 120/72 | HR 89 | Temp 98.4°F | Wt 177.4 lb

## 2015-09-04 DIAGNOSIS — O09523 Supervision of elderly multigravida, third trimester: Secondary | ICD-10-CM | POA: Insufficient documentation

## 2015-09-04 DIAGNOSIS — R319 Hematuria, unspecified: Secondary | ICD-10-CM

## 2015-09-04 DIAGNOSIS — Z3A36 36 weeks gestation of pregnancy: Secondary | ICD-10-CM

## 2015-09-04 DIAGNOSIS — O99019 Anemia complicating pregnancy, unspecified trimester: Secondary | ICD-10-CM

## 2015-09-04 DIAGNOSIS — Z113 Encounter for screening for infections with a predominantly sexual mode of transmission: Secondary | ICD-10-CM | POA: Diagnosis present

## 2015-09-04 LAB — POCT URINALYSIS DIP (DEVICE)
Bilirubin Urine: NEGATIVE
Glucose, UA: NEGATIVE mg/dL
Ketones, ur: NEGATIVE mg/dL
NITRITE: NEGATIVE
PH: 6 (ref 5.0–8.0)
PROTEIN: NEGATIVE mg/dL
SPECIFIC GRAVITY, URINE: 1.025 (ref 1.005–1.030)
UROBILINOGEN UA: 0.2 mg/dL (ref 0.0–1.0)

## 2015-09-04 LAB — OB RESULTS CONSOLE GBS: STREP GROUP B AG: POSITIVE

## 2015-09-04 LAB — OB RESULTS CONSOLE GC/CHLAMYDIA: GC PROBE AMP, GENITAL: NEGATIVE

## 2015-09-04 NOTE — Patient Instructions (Signed)
Third Trimester of Pregnancy The third trimester is from week 29 through week 42, months 7 through 9. The third trimester is a time when the fetus is growing rapidly. At the end of the ninth month, the fetus is about 20 inches in length and weighs 6-10 pounds.  BODY CHANGES Your body goes through many changes during pregnancy. The changes vary from woman to woman.   Your weight will continue to increase. You can expect to gain 25-35 pounds (11-16 kg) by the end of the pregnancy.  You may begin to get stretch marks on your hips, abdomen, and breasts.  You may urinate more often because the fetus is moving lower into your pelvis and pressing on your bladder.  You may develop or continue to have heartburn as a result of your pregnancy.  You may develop constipation because certain hormones are causing the muscles that push waste through your intestines to slow down.  You may develop hemorrhoids or swollen, bulging veins (varicose veins).  You may have pelvic pain because of the weight gain and pregnancy hormones relaxing your joints between the bones in your pelvis. Backaches may result from overexertion of the muscles supporting your posture.  You may have changes in your hair. These can include thickening of your hair, rapid growth, and changes in texture. Some women also have hair loss during or after pregnancy, or hair that feels dry or thin. Your hair will most likely return to normal after your baby is born.  Your breasts will continue to grow and be tender. A yellow discharge may leak from your breasts called colostrum.  Your belly button may stick out.  You may feel short of breath because of your expanding uterus.  You may notice the fetus "dropping," or moving lower in your abdomen.  You may have a bloody mucus discharge. This usually occurs a few days to a week before labor begins.  Your cervix becomes thin and soft (effaced) near your due date. WHAT TO EXPECT AT YOUR  PRENATAL EXAMS  You will have prenatal exams every 2 weeks until week 36. Then, you will have weekly prenatal exams. During a routine prenatal visit:  You will be weighed to make sure you and the fetus are growing normally.  Your blood pressure is taken.  Your abdomen will be measured to track your baby's growth.  The fetal heartbeat will be listened to.  Any test results from the previous visit will be discussed.  You may have a cervical check near your due date to see if you have effaced. At around 36 weeks, your caregiver will check your cervix. At the same time, your caregiver will also perform a test on the secretions of the vaginal tissue. This test is to determine if a type of bacteria, Group B streptococcus, is present. Your caregiver will explain this further. Your caregiver may ask you:  What your birth plan is.  How you are feeling.  If you are feeling the baby move.  If you have had any abnormal symptoms, such as leaking fluid, bleeding, severe headaches, or abdominal cramping.  If you are using any tobacco products, including cigarettes, chewing tobacco, and electronic cigarettes.  If you have any questions. Other tests or screenings that may be performed during your third trimester include:  Blood tests that check for low iron levels (anemia).  Fetal testing to check the health, activity level, and growth of the fetus. Testing is done if you have certain medical conditions or if   there are problems during the pregnancy.  HIV (human immunodeficiency virus) testing. If you are at high risk, you may be screened for HIV during your third trimester of pregnancy. FALSE LABOR You may feel small, irregular contractions that eventually go away. These are called Braxton Hicks contractions, or false labor. Contractions may last for hours, days, or even weeks before true labor sets in. If contractions come at regular intervals, intensify, or become painful, it is best to be seen  by your caregiver.  SIGNS OF LABOR   Menstrual-like cramps.  Contractions that are 5 minutes apart or less.  Contractions that start on the top of the uterus and spread down to the lower abdomen and back.  A sense of increased pelvic pressure or back pain.  A watery or bloody mucus discharge that comes from the vagina. If you have any of these signs before the 37th week of pregnancy, call your caregiver right away. You need to go to the hospital to get checked immediately. HOME CARE INSTRUCTIONS   Avoid all smoking, herbs, alcohol, and unprescribed drugs. These chemicals affect the formation and growth of the baby.  Do not use any tobacco products, including cigarettes, chewing tobacco, and electronic cigarettes. If you need help quitting, ask your health care provider. You may receive counseling support and other resources to help you quit.  Follow your caregiver's instructions regarding medicine use. There are medicines that are either safe or unsafe to take during pregnancy.  Exercise only as directed by your caregiver. Experiencing uterine cramps is a good sign to stop exercising.  Continue to eat regular, healthy meals.  Wear a good support bra for breast tenderness.  Do not use hot tubs, steam rooms, or saunas.  Wear your seat belt at all times when driving.  Avoid raw meat, uncooked cheese, cat litter boxes, and soil used by cats. These carry germs that can cause birth defects in the baby.  Take your prenatal vitamins.  Take 1500-2000 mg of calcium daily starting at the 20th week of pregnancy until you deliver your baby.  Try taking a stool softener (if your caregiver approves) if you develop constipation. Eat more high-fiber foods, such as fresh vegetables or fruit and whole grains. Drink plenty of fluids to keep your urine clear or pale yellow.  Take warm sitz baths to soothe any pain or discomfort caused by hemorrhoids. Use hemorrhoid cream if your caregiver  approves.  If you develop varicose veins, wear support hose. Elevate your feet for 15 minutes, 3-4 times a day. Limit salt in your diet.  Avoid heavy lifting, wear low heal shoes, and practice good posture.  Rest a lot with your legs elevated if you have leg cramps or low back pain.  Visit your dentist if you have not gone during your pregnancy. Use a soft toothbrush to brush your teeth and be gentle when you floss.  A sexual relationship may be continued unless your caregiver directs you otherwise.  Do not travel far distances unless it is absolutely necessary and only with the approval of your caregiver.  Take prenatal classes to understand, practice, and ask questions about the labor and delivery.  Make a trial run to the hospital.  Pack your hospital bag.  Prepare the baby's nursery.  Continue to go to all your prenatal visits as directed by your caregiver. SEEK MEDICAL CARE IF:  You are unsure if you are in labor or if your water has broken.  You have dizziness.  You have   mild pelvic cramps, pelvic pressure, or nagging pain in your abdominal area.  You have persistent nausea, vomiting, or diarrhea.  You have a bad smelling vaginal discharge.  You have pain with urination. SEEK IMMEDIATE MEDICAL CARE IF:   You have a fever.  You are leaking fluid from your vagina.  You have spotting or bleeding from your vagina.  You have severe abdominal cramping or pain.  You have rapid weight loss or gain.  You have shortness of breath with chest pain.  You notice sudden or extreme swelling of your face, hands, ankles, feet, or legs.  You have not felt your baby move in over an hour.  You have severe headaches that do not go away with medicine.  You have vision changes.   This information is not intended to replace advice given to you by your health care provider. Make sure you discuss any questions you have with your health care provider.   Document Released:  08/27/2001 Document Revised: 09/23/2014 Document Reviewed: 11/03/2012 Elsevier Interactive Patient Education 2016 [employer]  Breastfeeding Deciding to breastfeed is one of the best choices you can make for you and your baby. A change in hormones during pregnancy causes your breast tissue to grow and increases the number and size of your milk ducts. These hormones also allow proteins, sugars, and fats from your blood supply to make breast milk in your milk-producing glands. Hormones prevent breast milk from being released before your baby is born as well as prompt milk flow after birth. Once breastfeeding has begun, thoughts of your baby, as well as his or her sucking or crying, can stimulate the release of milk from your milk-producing glands.  BENEFITS OF BREASTFEEDING For Your Baby  Your first milk (colostrum) helps your baby's digestive system function better.  There are antibodies in your milk that help your baby fight off infections.  Your baby has a lower incidence of asthma, allergies, and sudden infant death syndrome.  The nutrients in breast milk are better for your baby than infant formulas and are designed uniquely for your baby's needs.  Breast milk improves your baby's brain development.  Your baby is less likely to develop other conditions, such as childhood obesity, asthma, or type 2 diabetes mellitus. For You  Breastfeeding helps to create a very special bond between you and your baby.  Breastfeeding is convenient. Breast milk is always available at the correct temperature and costs nothing.  Breastfeeding helps to burn calories and helps you lose the weight gained during pregnancy.  Breastfeeding makes your uterus contract to its prepregnancy size faster and slows bleeding (lochia) after you give birth.   Breastfeeding helps to lower your risk of developing type 2 diabetes mellitus, osteoporosis, and breast or ovarian cancer later in life. SIGNS THAT YOUR BABY IS  HUNGRY Early Signs of Hunger  Increased alertness or activity.  Stretching.  Movement of the head from side to side.  Movement of the head and opening of the mouth when the corner of the mouth or cheek is stroked (rooting).  Increased sucking sounds, smacking lips, cooing, sighing, or squeaking.  Hand-to-mouth movements.  Increased sucking of fingers or hands. Late Signs of Hunger  Fussing.  Intermittent crying. Extreme Signs of Hunger Signs of extreme hunger will require calming and consoling before your baby will be able to breastfeed successfully. Do not wait for the following signs of extreme hunger to occur before you initiate breastfeeding:  Restlessness.  A loud, strong cry.  Screaming.   BREASTFEEDING BASICS Breastfeeding Initiation  Find a comfortable place to sit or lie down, with your neck and back well supported.  Place a pillow or rolled up blanket under your baby to bring him or her to the level of your breast (if you are seated). Nursing pillows are specially designed to help support your arms and your baby while you breastfeed.  Make sure that your baby's abdomen is facing your abdomen.  Gently massage your breast. With your fingertips, massage from your chest wall toward your nipple in a circular motion. This encourages milk flow. You may need to continue this action during the feeding if your milk flows slowly.  Support your breast with 4 fingers underneath and your thumb above your nipple. Make sure your fingers are well away from your nipple and your baby's mouth.  Stroke your baby's lips gently with your finger or nipple.  When your baby's mouth is open wide enough, quickly bring your baby to your breast, placing your entire nipple and as much of the colored area around your nipple (areola) as possible into your baby's mouth.  More areola should be visible above your baby's upper lip than below the lower lip.  Your baby's tongue should be between his  or her lower gum and your breast.  Ensure that your baby's mouth is correctly positioned around your nipple (latched). Your baby's lips should create a seal on your breast and be turned out (everted).  It is common for your baby to suck about 2-3 minutes in order to start the flow of breast milk. Latching Teaching your baby how to latch on to your breast properly is very important. An improper latch can cause nipple pain and decreased milk supply for you and poor weight gain in your baby. Also, if your baby is not latched onto your nipple properly, he or she may swallow some air during feeding. This can make your baby fussy. Burping your baby when you switch breasts during the feeding can help to get rid of the air. However, teaching your baby to latch on properly is still the best way to prevent fussiness from swallowing air while breastfeeding. Signs that your baby has successfully latched on to your nipple:  Silent tugging or silent sucking, without causing you pain.  Swallowing heard between every 3-4 sucks.  Muscle movement above and in front of his or her ears while sucking. Signs that your baby has not successfully latched on to nipple:  Sucking sounds or smacking sounds from your baby while breastfeeding.  Nipple pain. If you think your baby has not latched on correctly, slip your finger into the corner of your baby's mouth to break the suction and place it between your baby's gums. Attempt breastfeeding initiation again. Signs of Successful Breastfeeding Signs from your baby:  A gradual decrease in the number of sucks or complete cessation of sucking.  Falling asleep.  Relaxation of his or her body.  Retention of a small amount of milk in his or her mouth.  Letting go of your breast by himself or herself. Signs from you:  Breasts that have increased in firmness, weight, and size 1-3 hours after feeding.  Breasts that are softer immediately after  breastfeeding.  Increased milk volume, as well as a change in milk consistency and color by the fifth day of breastfeeding.  Nipples that are not sore, cracked, or bleeding. Signs That Your Baby is Getting Enough Milk  Wetting at least 3 diapers in a 24-hour period.   The urine should be clear and pale yellow by age 5 days.  At least 3 stools in a 24-hour period by age 5 days. The stool should be soft and yellow.  At least 3 stools in a 24-hour period by age 7 days. The stool should be seedy and yellow.  No loss of weight greater than 10% of birth weight during the first 3 days of age.  Average weight gain of 4-7 ounces (113-198 g) per week after age 4 days.  Consistent daily weight gain by age 5 days, without weight loss after the age of 2 weeks. After a feeding, your baby may spit up a small amount. This is common. BREASTFEEDING FREQUENCY AND DURATION Frequent feeding will help you make more milk and can prevent sore nipples and breast engorgement. Breastfeed when you feel the need to reduce the fullness of your breasts or when your baby shows signs of hunger. This is called "breastfeeding on demand." Avoid introducing a pacifier to your baby while you are working to establish breastfeeding (the first 4-6 weeks after your baby is born). After this time you may choose to use a pacifier. Research has shown that pacifier use during the first year of a baby's life decreases the risk of sudden infant death syndrome (SIDS). Allow your baby to feed on each breast as long as he or she wants. Breastfeed until your baby is finished feeding. When your baby unlatches or falls asleep while feeding from the first breast, offer the second breast. Because newborns are often sleepy in the first few weeks of life, you may need to awaken your baby to get him or her to feed. Breastfeeding times will vary from baby to baby. However, the following rules can serve as a guide to help you ensure that your baby is  properly fed:  Newborns (babies 4 weeks of age or younger) may breastfeed every 1-3 hours.  Newborns should not go longer than 3 hours during the day or 5 hours during the night without breastfeeding.  You should breastfeed your baby a minimum of 8 times in a 24-hour period until you begin to introduce solid foods to your baby at around 6 months of age. BREAST MILK PUMPING Pumping and storing breast milk allows you to ensure that your baby is exclusively fed your breast milk, even at times when you are unable to breastfeed. This is especially important if you are going back to work while you are still breastfeeding or when you are not able to be present during feedings. Your lactation consultant can give you guidelines on how long it is safe to store breast milk. A breast pump is a machine that allows you to pump milk from your breast into a sterile bottle. The pumped breast milk can then be stored in a refrigerator or freezer. Some breast pumps are operated by hand, while others use electricity. Ask your lactation consultant which type will work best for you. Breast pumps can be purchased, but some hospitals and breastfeeding support groups lease breast pumps on a monthly basis. A lactation consultant can teach you how to hand express breast milk, if you prefer not to use a pump. CARING FOR YOUR BREASTS WHILE YOU BREASTFEED Nipples can become dry, cracked, and sore while breastfeeding. The following recommendations can help keep your breasts moisturized and healthy:  Avoid using soap on your nipples.  Wear a supportive bra. Although not required, special nursing bras and tank tops are designed to allow access to your   breasts for breastfeeding without taking off your entire bra or top. Avoid wearing underwire-style bras or extremely tight bras.  Air dry your nipples for 3-4minutes after each feeding.  Use only cotton bra pads to absorb leaked breast milk. Leaking of breast milk between feedings  is normal.  Use lanolin on your nipples after breastfeeding. Lanolin helps to maintain your skin's normal moisture barrier. If you use pure lanolin, you do not need to wash it off before feeding your baby again. Pure lanolin is not toxic to your baby. You may also hand express a few drops of breast milk and gently massage that milk into your nipples and allow the milk to air dry. In the first few weeks after giving birth, some women experience extremely full breasts (engorgement). Engorgement can make your breasts feel heavy, warm, and tender to the touch. Engorgement peaks within 3-5 days after you give birth. The following recommendations can help ease engorgement:  Completely empty your breasts while breastfeeding or pumping. You may want to start by applying warm, moist heat (in the shower or with warm water-soaked hand towels) just before feeding or pumping. This increases circulation and helps the milk flow. If your baby does not completely empty your breasts while breastfeeding, pump any extra milk after he or she is finished.  Wear a snug bra (nursing or regular) or tank top for 1-2 days to signal your body to slightly decrease milk production.  Apply ice packs to your breasts, unless this is too uncomfortable for you.  Make sure that your baby is latched on and positioned properly while breastfeeding. If engorgement persists after 48 hours of following these recommendations, contact your health care provider or a lactation consultant. OVERALL HEALTH CARE RECOMMENDATIONS WHILE BREASTFEEDING  Eat healthy foods. Alternate between meals and snacks, eating 3 of each per day. Because what you eat affects your breast milk, some of the foods may make your baby more irritable than usual. Avoid eating these foods if you are sure that they are negatively affecting your baby.  Drink milk, fruit juice, and water to satisfy your thirst (about 10 glasses a day).  Rest often, relax, and continue to take  your prenatal vitamins to prevent fatigue, stress, and anemia.  Continue breast self-awareness checks.  Avoid chewing and smoking tobacco. Chemicals from cigarettes that pass into breast milk and exposure to secondhand smoke may harm your baby.  Avoid alcohol and drug use, including marijuana. Some medicines that may be harmful to your baby can pass through breast milk. It is important to ask your health care provider before taking any medicine, including all over-the-counter and prescription medicine as well as vitamin and herbal supplements. It is possible to become pregnant while breastfeeding. If birth control is desired, ask your health care provider about options that will be safe for your baby. SEEK MEDICAL CARE IF:  You feel like you want to stop breastfeeding or have become frustrated with breastfeeding.  You have painful breasts or nipples.  Your nipples are cracked or bleeding.  Your breasts are red, tender, or warm.  You have a swollen area on either breast.  You have a fever or chills.  You have nausea or vomiting.  You have drainage other than breast milk from your nipples.  Your breasts do not become full before feedings by the fifth day after you give birth.  You feel sad and depressed.  Your baby is too sleepy to eat well.  Your baby is having trouble sleeping.     Your baby is wetting less than 3 diapers in a 24-hour period.  Your baby has less than 3 stools in a 24-hour period.  Your baby's skin or the white part of his or her eyes becomes yellow.   Your baby is not gaining weight by 5 days of age. SEEK IMMEDIATE MEDICAL CARE IF:  Your baby is overly tired (lethargic) and does not want to wake up and feed.  Your baby develops an unexplained fever.   This information is not intended to replace advice given to you by your health care provider. Make sure you discuss any questions you have with your health care provider.   Document Released: 09/02/2005  Document Revised: 05/24/2015 Document Reviewed: 02/24/2013 [employer]vier Interactive Patient Education 2016 [employer]  

## 2015-09-04 NOTE — Progress Notes (Signed)
Subjective:  Lynn Lawrence is a 39 y.o. [id number] at 2185w2d being seen today for ongoing prenatal care.  She is currently monitored for the following issues for this high-risk pregnancy and has Advanced maternal age in multigravida and Anemia on her problem list.  Patient reports no complaints.  Contractions: Not present. Vag. Bleeding: None.  Movement: Present. Denies leaking of fluid.   The following portions of the patient's history were reviewed and updated as appropriate: allergies, current medications, past family history, past medical history, past social history, past surgical history and problem list. Problem list updated.  Objective:   Filed Vitals:   09/04/15 0847  BP: 120/72  Pulse: 89  Temp: 98.4 F (36.9 C)  Weight: 177 lb 6.4 oz (80.468 kg)    Fetal Status: Fetal Heart Rate (bpm): 135 Fundal Height: 35 cm Movement: Present  Presentation: Vertex  General:  Alert, oriented and cooperative. Patient is in no acute distress.  Skin: Skin is warm and dry. No rash noted.   Cardiovascular: Normal heart rate noted  Respiratory: Normal respiratory effort, no problems with respiration noted  Abdomen: Soft, gravid, appropriate for gestational age. Pain/Pressure: Present     Pelvic: Vag. Bleeding: None     Cervical exam performed Dilation: 1 Effacement (%): Thick Station: -3  Extremities: Normal range of motion.  Edema: None  Mental Status: Normal mood and affect. Normal behavior. Normal judgment and thought content.   Urinalysis: Urine Protein: Negative Urine Glucose: Negative NST reviewed and reactive.  Assessment and Plan:  Pregnancy: [id number] at 3885w2d  1. Advanced maternal age in multigravida, third trimester Continue prenatal care.  - Culture, beta strep (group b only) - GC/Chlamydia probe amp ([place])[facility] at [facility] - Fetal nonstress test-begin 2x/wk testing and continue until IOL at 40 wks if undelivered. U/s for growth today.  2. Hematuria  - Culture, OB  Urine  Term labor symptoms and general obstetric precautions including but not limited to vaginal bleeding, contractions, leaking of fluid and fetal movement were reviewed in detail with the patient. Please refer to After Visit Summary for other counseling recommendations.  Return in 1 week (on 09/11/2015) for OB visit and NST, NST only in 4 days.   Reva Boresanya S Stockton Nunley, MD

## 2015-09-04 NOTE — Progress Notes (Signed)
Urine large amt leukocytes, trace hgb Breastfeeding tip of the week reviewed Cultures today

## 2015-09-05 LAB — GC/CHLAMYDIA PROBE AMP (~~LOC~~) NOT AT ARMC
Chlamydia: NEGATIVE
NEISSERIA GONORRHEA: NEGATIVE

## 2015-09-07 LAB — CULTURE, OB URINE: Colony Count: >=100000

## 2015-09-09 LAB — CULTURE, BETA STREP (GROUP B ONLY)

## 2015-09-12 ENCOUNTER — Ambulatory Visit (INDEPENDENT_AMBULATORY_CARE_PROVIDER_SITE_OTHER): Payer: Medicaid Other | Admitting: *Deleted

## 2015-09-12 ENCOUNTER — Encounter: Payer: Self-pay | Admitting: Family Medicine

## 2015-09-12 VITALS — BP 110/72 | HR 105

## 2015-09-12 DIAGNOSIS — Z36 Encounter for antenatal screening of mother: Secondary | ICD-10-CM

## 2015-09-12 DIAGNOSIS — O99820 Streptococcus B carrier state complicating pregnancy: Secondary | ICD-10-CM | POA: Insufficient documentation

## 2015-09-12 DIAGNOSIS — O09523 Supervision of elderly multigravida, third trimester: Secondary | ICD-10-CM

## 2015-09-12 NOTE — Addendum Note (Signed)
Addended by: Jill SideAY, DIANE L on: 09/12/2015 04:05 PM   Modules accepted: Orders

## 2015-09-12 NOTE — Progress Notes (Signed)
NST reviewed and reactive.  

## 2015-09-14 ENCOUNTER — Ambulatory Visit (INDEPENDENT_AMBULATORY_CARE_PROVIDER_SITE_OTHER): Payer: Medicaid Other | Admitting: *Deleted

## 2015-09-14 VITALS — BP 114/74 | HR 103

## 2015-09-14 DIAGNOSIS — O09523 Supervision of elderly multigravida, third trimester: Secondary | ICD-10-CM

## 2015-09-17 NOTE — L&D Delivery Note (Signed)
Delivery Note At 12:13 AM a viable female was delivered via Vaginal, Spontaneous Delivery (Presentation: Right Occiput Anterior).  APGAR: 8, 10; weight  pending.   Placenta status: Intact, Spontaneous.  Cord: 3 vessels with the following complications: None.  Cord pH: not obtained. Loose nuchal cord noted on delivery. Delivered throughout without complication.   Anesthesia: Local  Episiotomy: None Lacerations: 2nd degree perineal  Suture Repair: 3.0 monocryl Est. Blood Loss (mL):  200  Mom to postpartum.  Baby to Couplet care / Skin to Skin.  Jacquiline DoeCaleb Parker 10/01/2015, 12:47 AM   I was gloved and present for entire delivery Agree with note No difficulty with shoulders Loose nuchal cord x 1 delivered through it Aviva SignsMarie L Joley Utecht, CNM

## 2015-09-18 ENCOUNTER — Encounter: Payer: Self-pay | Admitting: Obstetrics & Gynecology

## 2015-09-18 ENCOUNTER — Ambulatory Visit (INDEPENDENT_AMBULATORY_CARE_PROVIDER_SITE_OTHER): Payer: Medicaid Other | Admitting: Obstetrics & Gynecology

## 2015-09-18 ENCOUNTER — Encounter (HOSPITAL_COMMUNITY): Payer: Self-pay

## 2015-09-18 ENCOUNTER — Inpatient Hospital Stay (HOSPITAL_COMMUNITY)
Admission: AD | Admit: 2015-09-18 | Discharge: 2015-09-18 | Disposition: A | Discharge status: Home / Self Care | Payer: Medicaid Other | Source: Ambulatory Visit | Attending: Family Medicine | Admitting: Family Medicine

## 2015-09-18 VITALS — BP 118/71 | HR 96 | Wt 182.6 lb

## 2015-09-18 DIAGNOSIS — O99820 Streptococcus B carrier state complicating pregnancy: Secondary | ICD-10-CM

## 2015-09-18 DIAGNOSIS — O09523 Supervision of elderly multigravida, third trimester: Secondary | ICD-10-CM | POA: Diagnosis present

## 2015-09-18 DIAGNOSIS — Z36 Encounter for antenatal screening of mother: Secondary | ICD-10-CM

## 2015-09-18 DIAGNOSIS — O99013 Anemia complicating pregnancy, third trimester: Secondary | ICD-10-CM

## 2015-09-18 DIAGNOSIS — Z3493 Encounter for supervision of normal pregnancy, unspecified, third trimester: Secondary | ICD-10-CM | POA: Diagnosis not present

## 2015-09-18 DIAGNOSIS — D649 Anemia, unspecified: Secondary | ICD-10-CM

## 2015-09-18 LAB — POCT URINALYSIS DIP (DEVICE)
Bilirubin Urine: NEGATIVE
GLUCOSE, UA: NEGATIVE mg/dL
Ketones, ur: NEGATIVE mg/dL
NITRITE: NEGATIVE
PH: 5.5 (ref 5.0–8.0)
PROTEIN: NEGATIVE mg/dL
Specific Gravity, Urine: 1.01 (ref 1.005–1.030)
UROBILINOGEN UA: 0.2 mg/dL (ref 0.0–1.0)

## 2015-09-18 NOTE — Progress Notes (Signed)
May d/c home 

## 2015-09-18 NOTE — Progress Notes (Signed)
Scheduled for Induction of Labor on 09/30/15 at 0700.Large leukocytes noted on urinalysis.

## 2015-09-18 NOTE — Progress Notes (Signed)
Subjective: IOL 1/14 scheduled  Lynn Lawrence is a 40 y.o. [id number] at 7451w2d being seen today for ongoing prenatal care.  She is currently monitored for the following issues for this high-risk pregnancy and has Advanced maternal age in multigravida; Anemia; and Group B Streptococcus carrier, +RV culture, currently pregnant on her problem list.  Patient reports no complaints.  Contractions: Irregular. Vag. Bleeding: None.  Movement: Present. Denies leaking of fluid.   The following portions of the patient's history were reviewed and updated as appropriate: allergies, current medications, past family history, past medical history, past social history, past surgical history and problem list. Problem list updated.  Objective:   Filed Vitals:   09/18/15 0929  BP: 118/71  Pulse: 96  Weight: 182 lb 9.6 oz (82.827 kg)    Fetal Status:     Movement: Present  Presentation: Vertex  General:  Alert, oriented and cooperative. Patient is in no acute distress.  Skin: Skin is warm and dry. No rash noted.   Cardiovascular: Normal heart rate noted  Respiratory: Normal respiratory effort, no problems with respiration noted  Abdomen: Soft, gravid, appropriate for gestational age. Pain/Pressure: Present     Pelvic: Vag. Bleeding: None     Cervical exam deferred        Extremities: Normal range of motion.  Edema: Trace  Mental Status: Normal mood and affect. Normal behavior. Normal judgment and thought content.   Urinalysis: Urine Protein: Negative Urine Glucose: Negative  Assessment and Plan:  Pregnancy: [id number] at 3851w2d  1. Advanced maternal age in multigravida, third trimester NST reactive - Amniotic fluid index with NST  Term labor symptoms and general obstetric precautions including but not limited to vaginal bleeding, contractions, leaking of fluid and fetal movement were reviewed in detail with the patient. Please refer to After Visit Summary for other counseling recommendations.  Return  in about 4 days (around 09/22/2015).for NST   Adam PhenixJames G Arnold, MD

## 2015-09-18 NOTE — MAU Note (Signed)
Pt here with c/o contractions, denies leaking of fluid or bleeding. Was seen in the office for scheduled appointment today, denies problems with the pregnancy. Reports positive fetal movemt.

## 2015-09-18 NOTE — Patient Instructions (Signed)

## 2015-09-19 ENCOUNTER — Other Ambulatory Visit: Payer: Medicaid Other

## 2015-09-22 ENCOUNTER — Ambulatory Visit (INDEPENDENT_AMBULATORY_CARE_PROVIDER_SITE_OTHER): Payer: Medicaid Other | Admitting: *Deleted

## 2015-09-22 VITALS — BP 118/69 | HR 97

## 2015-09-22 DIAGNOSIS — O09523 Supervision of elderly multigravida, third trimester: Secondary | ICD-10-CM

## 2015-09-22 NOTE — Progress Notes (Signed)
09/22/2015- NST reviewed and reactive 

## 2015-09-25 ENCOUNTER — Other Ambulatory Visit: Payer: Medicaid Other

## 2015-09-27 ENCOUNTER — Encounter (HOSPITAL_COMMUNITY): Payer: Self-pay | Admitting: *Deleted

## 2015-09-27 ENCOUNTER — Telehealth (HOSPITAL_COMMUNITY): Payer: Self-pay | Admitting: *Deleted

## 2015-09-27 NOTE — Telephone Encounter (Signed)
Preadmission screen  

## 2015-09-29 ENCOUNTER — Ambulatory Visit (INDEPENDENT_AMBULATORY_CARE_PROVIDER_SITE_OTHER): Payer: Medicaid Other | Admitting: *Deleted

## 2015-09-29 VITALS — BP 119/65 | HR 96

## 2015-09-29 DIAGNOSIS — O09523 Supervision of elderly multigravida, third trimester: Secondary | ICD-10-CM | POA: Diagnosis not present

## 2015-09-29 NOTE — Progress Notes (Signed)
NST reactive.

## 2015-09-29 NOTE — Progress Notes (Signed)
IOL scheduled tomorrow @ 0700

## 2015-09-30 ENCOUNTER — Inpatient Hospital Stay (HOSPITAL_COMMUNITY)
Admit: 2015-09-30 | Discharge: 2015-10-02 | DRG: 775 | Disposition: A | Discharge status: Home / Self Care | Payer: Medicaid Other | Source: Ambulatory Visit | Attending: Obstetrics & Gynecology | Admitting: Obstetrics & Gynecology

## 2015-09-30 ENCOUNTER — Encounter (HOSPITAL_COMMUNITY): Payer: Self-pay

## 2015-09-30 ENCOUNTER — Inpatient Hospital Stay (HOSPITAL_COMMUNITY)
Admission: RE | Admit: 2015-09-30 | Discharge: 2015-09-30 | Disposition: A | Discharge status: Home / Self Care | Payer: Medicaid Other | Source: Ambulatory Visit | Attending: Obstetrics & Gynecology | Admitting: Obstetrics & Gynecology

## 2015-09-30 DIAGNOSIS — O99824 Streptococcus B carrier state complicating childbirth: Secondary | ICD-10-CM | POA: Diagnosis present

## 2015-09-30 DIAGNOSIS — Z833 Family history of diabetes mellitus: Secondary | ICD-10-CM

## 2015-09-30 DIAGNOSIS — Z3A40 40 weeks gestation of pregnancy: Secondary | ICD-10-CM

## 2015-09-30 DIAGNOSIS — O09523 Supervision of elderly multigravida, third trimester: Secondary | ICD-10-CM | POA: Diagnosis not present

## 2015-09-30 DIAGNOSIS — O99820 Streptococcus B carrier state complicating pregnancy: Secondary | ICD-10-CM

## 2015-09-30 DIAGNOSIS — O09529 Supervision of elderly multigravida, unspecified trimester: Secondary | ICD-10-CM

## 2015-09-30 DIAGNOSIS — O480 Post-term pregnancy: Secondary | ICD-10-CM | POA: Diagnosis present

## 2015-09-30 DIAGNOSIS — O9902 Anemia complicating childbirth: Secondary | ICD-10-CM | POA: Diagnosis present

## 2015-09-30 DIAGNOSIS — D649 Anemia, unspecified: Secondary | ICD-10-CM | POA: Diagnosis present

## 2015-09-30 LAB — CBC
HEMATOCRIT: 34.8 % — AB (ref 36.0–46.0)
Hemoglobin: 11.3 g/dL — ABNORMAL LOW (ref 12.0–15.0)
MCH: 24.1 pg — ABNORMAL LOW (ref 26.0–34.0)
MCHC: 32.5 g/dL (ref 30.0–36.0)
MCV: 74.2 fL — AB (ref 78.0–100.0)
Platelets: 109 10*3/uL — ABNORMAL LOW (ref 150–400)
RBC: 4.69 MIL/uL (ref 3.87–5.11)
RDW: 15 % (ref 11.5–15.5)
WBC: 6.1 10*3/uL (ref 4.0–10.5)

## 2015-09-30 LAB — TYPE AND SCREEN
ABO/RH(D): B POS
ANTIBODY SCREEN: NEGATIVE

## 2015-09-30 LAB — ABO/RH: ABO/RH(D): B POS

## 2015-09-30 MED ORDER — FENTANYL 2.5 MCG/ML BUPIVACAINE 1/10 % EPIDURAL INFUSION (WH - ANES): 14.0000 mL/h | INTRAMUSCULAR | Status: DC | PRN

## 2015-09-30 MED ORDER — LACTATED RINGERS IV SOLN
INTRAVENOUS | Status: DC
  Administered 2015-09-30 (×2): via INTRAVENOUS
  Administered 2015-10-01: 125 mL/h via INTRAVENOUS

## 2015-09-30 MED ORDER — TERBUTALINE SULFATE 1 MG/ML IJ SOLN: 0.2500 mg | Freq: Once | INTRAMUSCULAR | Status: DC | PRN

## 2015-09-30 MED ORDER — PHENYLEPHRINE 40 MCG/ML (10ML) SYRINGE FOR IV PUSH (FOR BLOOD PRESSURE SUPPORT)
PREFILLED_SYRINGE | INTRAVENOUS | Status: AC
  Filled 2015-09-30: qty 20

## 2015-09-30 MED ORDER — DIPHENHYDRAMINE HCL 50 MG/ML IJ SOLN: 12.5000 mg | INTRAMUSCULAR | Status: DC | PRN

## 2015-09-30 MED ORDER — MISOPROSTOL 25 MCG QUARTER TABLET
25.0000 ug | ORAL_TABLET | ORAL | Status: DC
  Administered 2015-09-30: 25 ug via VAGINAL
  Filled 2015-09-30 (×2): qty 0.25

## 2015-09-30 MED ORDER — FENTANYL CITRATE (PF) 100 MCG/2ML IJ SOLN: 100.0000 ug | INTRAMUSCULAR | Status: DC | PRN

## 2015-09-30 MED ORDER — OXYTOCIN 10 UNIT/ML IJ SOLN
1.0000 m[IU]/min | INTRAVENOUS | Status: DC
  Administered 2015-09-30: 1 m[IU]/min via INTRAVENOUS
  Filled 2015-09-30: qty 4

## 2015-09-30 MED ORDER — FENTANYL 2.5 MCG/ML BUPIVACAINE 1/10 % EPIDURAL INFUSION (WH - ANES)
INTRAMUSCULAR | Status: AC
  Filled 2015-09-30: qty 125

## 2015-09-30 MED ORDER — EPHEDRINE 5 MG/ML INJ: 10.0000 mg | INTRAVENOUS | Status: DC | PRN

## 2015-09-30 MED ORDER — OXYTOCIN BOLUS FROM INFUSION: 500.0000 mL | INTRAVENOUS | Status: DC

## 2015-09-30 MED ORDER — PENICILLIN G POTASSIUM 5000000 UNITS IJ SOLR
2.5000 10*6.[IU] | INTRAVENOUS | Status: DC
  Administered 2015-09-30 (×3): 2.5 10*6.[IU] via INTRAVENOUS
  Filled 2015-09-30 (×8): qty 2.5

## 2015-09-30 MED ORDER — OXYCODONE-ACETAMINOPHEN 5-325 MG PO TABS: 1.0000 | ORAL_TABLET | ORAL | Status: DC | PRN

## 2015-09-30 MED ORDER — LIDOCAINE HCL (PF) 1 % IJ SOLN
30.0000 mL | INTRAMUSCULAR | Status: DC | PRN
  Administered 2015-10-01: 30 mL via SUBCUTANEOUS
  Filled 2015-09-30: qty 30

## 2015-09-30 MED ORDER — ZOLPIDEM TARTRATE 5 MG PO TABS: 5.0000 mg | ORAL_TABLET | Freq: Every evening | ORAL | Status: DC | PRN

## 2015-09-30 MED ORDER — LACTATED RINGERS IV SOLN
500.0000 mL | INTRAVENOUS | Status: DC | PRN
  Administered 2015-09-30: 500 mL via INTRAVENOUS

## 2015-09-30 MED ORDER — OXYCODONE-ACETAMINOPHEN 5-325 MG PO TABS: 2.0000 | ORAL_TABLET | ORAL | Status: DC | PRN

## 2015-09-30 MED ORDER — CITRIC ACID-SODIUM CITRATE 334-500 MG/5ML PO SOLN: 30.0000 mL | ORAL | Status: DC | PRN

## 2015-09-30 MED ORDER — PHENYLEPHRINE 40 MCG/ML (10ML) SYRINGE FOR IV PUSH (FOR BLOOD PRESSURE SUPPORT): 80.0000 ug | PREFILLED_SYRINGE | INTRAVENOUS | Status: DC | PRN

## 2015-09-30 MED ORDER — ACETAMINOPHEN 325 MG PO TABS: 650.0000 mg | ORAL_TABLET | ORAL | Status: DC | PRN

## 2015-09-30 MED ORDER — OXYTOCIN 10 UNIT/ML IJ SOLN
2.5000 [IU]/h | INTRAVENOUS | Status: DC
  Administered 2015-10-01: 39.96 [IU]/h via INTRAVENOUS

## 2015-09-30 MED ORDER — PENICILLIN G POTASSIUM 5000000 UNITS IJ SOLR
5.0000 10*6.[IU] | Freq: Once | INTRAMUSCULAR | Status: AC
  Administered 2015-09-30: 5 10*6.[IU] via INTRAVENOUS
  Filled 2015-09-30: qty 5

## 2015-09-30 MED ORDER — ONDANSETRON HCL 4 MG/2ML IJ SOLN: 4.0000 mg | Freq: Four times a day (QID) | INTRAMUSCULAR | Status: DC | PRN

## 2015-09-30 NOTE — H&P (Signed)
Lynn Lawrence is a 40 y.o. female [id number] @ 40.0wks by 16wk U/S presenting for IOL due to AMA. [facility] and has been remarkable for 1) AMA 2) anemia  History OB History    Gravida Para Term Preterm AB TAB SAB Ectopic Multiple Living   6 2 2  0 3 3    2      Past Medical History  Diagnosis Date  . Medical history non-contributory    Past Surgical History  Procedure Laterality Date  . No past surgeries     Family History: family history includes Diabetes in her mother. Social History:  reports that she has never smoked. She has never used smokeless tobacco. She reports that she drinks alcohol. She reports that she does not use illicit drugs.   Prenatal Transfer Tool  Maternal Diabetes: No Genetic Screening: Normal Maternal Ultrasounds/Referrals: Normal Fetal Ultrasounds or other Referrals:  None Maternal Substance Abuse:  No Significant Maternal Medications:  None Significant Maternal Lab Results:  Lab values include: Group B Strep positive Other Comments:  None AMA  ROS    Pulse 92, temperature 98.6 F (37 C), temperature source Oral, height 5\' 2"  (1.575 m), weight 82.555 kg (182 lb), last menstrual period 01/07/2015. Exam Physical Exam  Constitutional: She is oriented to person, place, and time. She appears well-developed.  HENT:  Head: Normocephalic.  Neck: Normal range of motion.  Cardiovascular: Normal rate.   Respiratory: Effort normal.  GI:  EFM 150s, +accels, no decels Rare ctx  Genitourinary:  Cx 1/50 per RN  Musculoskeletal: Normal range of motion.  Neurological: She is alert and oriented to person, place, and time.  Skin: Skin is warm and dry.  Psychiatric: She has a normal mood and affect. Her behavior is normal. Thought content normal.    Prenatal labs: ABO, Rh: B/POS/-- (07/19 1426) Antibody: NEG (07/19 1426) Rubella: 5.34 (07/19 1426) RPR: NON REAC (10/17 1046)  HBsAg: NEGATIVE (07/19 1426)  HIV: NONREACTIVE (10/17  1046)  GBS: Positive (12/19 0000)   Assessment/Plan: IUP@40 .0wks AMA Cx unfavorable  [facility] cytotec for ripening followed by either foley bulb or Pitocin PCN for GBS ppx Anticipate SVD   Lynn Lawrence CNM 09/30/2015, 7:31 AM

## 2015-09-30 NOTE — Progress Notes (Signed)
Patient ID: Lynn Lawrence, female   DOB: 10/18/1975, 40 y.o.   MRN: [id number] Doing well Breathing through contractions  Filed Vitals:   09/30/15 2006 09/30/15 2031 09/30/15 2101 09/30/15 2131  BP: 108/69 115/78 121/82 119/72  Pulse: 83 85 84 85  Temp:      TempSrc:      Resp: 14 14 16 14   Height:      Weight:       FHR reassuring UCs every 1-2 min  Dilation: 2.5 Effacement (%): 40, 50 Cervical Position: Anterior Station: -3 Presentation: Vertex Exam by:: Cletis MediaK. Anderson, RN  Wants to defer pain meds  Had a precipitous labor with her last child  Anticipate SVD

## 2015-10-01 ENCOUNTER — Encounter (HOSPITAL_COMMUNITY): Payer: Self-pay

## 2015-10-01 DIAGNOSIS — O09523 Supervision of elderly multigravida, third trimester: Secondary | ICD-10-CM

## 2015-10-01 DIAGNOSIS — Z3A40 40 weeks gestation of pregnancy: Secondary | ICD-10-CM

## 2015-10-01 DIAGNOSIS — O99824 Streptococcus B carrier state complicating childbirth: Secondary | ICD-10-CM

## 2015-10-01 LAB — CBC WITH DIFFERENTIAL/PLATELET
BASOS ABS: 0 10*3/uL (ref 0.0–0.1)
Band Neutrophils: 0 %
Basophils Relative: 0 %
Blasts: 0 %
Eosinophils Absolute: 0 10*3/uL (ref 0.0–0.7)
Eosinophils Relative: 0 %
HEMATOCRIT: 36.5 % (ref 36.0–46.0)
HEMOGLOBIN: 12.1 g/dL (ref 12.0–15.0)
Lymphocytes Relative: 27 %
Lymphs Abs: 2.8 10*3/uL (ref 0.7–4.0)
MCH: 24.5 pg — AB (ref 26.0–34.0)
MCHC: 33.2 g/dL (ref 30.0–36.0)
MCV: 74 fL — ABNORMAL LOW (ref 78.0–100.0)
METAMYELOCYTES PCT: 0 %
MONO ABS: 0.5 10*3/uL (ref 0.1–1.0)
MYELOCYTES: 0 %
Monocytes Relative: 5 %
NEUTROS PCT: 68 %
NRBC: 0 /100{WBCs}
Neutro Abs: 7.2 10*3/uL (ref 1.7–7.7)
Other: 0 %
PROMYELOCYTES ABS: 0 %
Platelets: 133 10*3/uL — ABNORMAL LOW (ref 150–400)
RBC: 4.93 MIL/uL (ref 3.87–5.11)
RDW: 15 % (ref 11.5–15.5)
WBC: 10.5 10*3/uL (ref 4.0–10.5)

## 2015-10-01 LAB — RPR: RPR Ser Ql: NONREACTIVE

## 2015-10-01 MED ORDER — DIBUCAINE 1 % RE OINT
1.0000 "application " | TOPICAL_OINTMENT | RECTAL | Status: DC | PRN
  Filled 2015-10-01: qty 28

## 2015-10-01 MED ORDER — WITCH HAZEL-GLYCERIN EX PADS: 1.0000 "application " | MEDICATED_PAD | CUTANEOUS | Status: DC | PRN

## 2015-10-01 MED ORDER — TETANUS-DIPHTH-ACELL PERTUSSIS 5-2.5-18.5 LF-MCG/0.5 IM SUSP: 0.5000 mL | Freq: Once | INTRAMUSCULAR | Status: DC

## 2015-10-01 MED ORDER — LANOLIN HYDROUS EX OINT: TOPICAL_OINTMENT | CUTANEOUS | Status: DC | PRN

## 2015-10-01 MED ORDER — ONDANSETRON HCL 4 MG PO TABS: 4.0000 mg | ORAL_TABLET | ORAL | Status: DC | PRN

## 2015-10-01 MED ORDER — SENNOSIDES-DOCUSATE SODIUM 8.6-50 MG PO TABS
2.0000 | ORAL_TABLET | ORAL | Status: DC
  Administered 2015-10-01: 2 via ORAL
  Filled 2015-10-01: qty 2

## 2015-10-01 MED ORDER — BENZOCAINE-MENTHOL 20-0.5 % EX AERO
1.0000 "application " | INHALATION_SPRAY | CUTANEOUS | Status: DC | PRN
  Filled 2015-10-01: qty 56

## 2015-10-01 MED ORDER — PRENATAL MULTIVITAMIN CH
1.0000 | ORAL_TABLET | Freq: Every day | ORAL | Status: DC
  Administered 2015-10-01 – 2015-10-02 (×2): 1 via ORAL
  Filled 2015-10-01 (×2): qty 1

## 2015-10-01 MED ORDER — ONDANSETRON HCL 4 MG/2ML IJ SOLN: 4.0000 mg | INTRAMUSCULAR | Status: DC | PRN

## 2015-10-01 MED ORDER — ZOLPIDEM TARTRATE 5 MG PO TABS: 5.0000 mg | ORAL_TABLET | Freq: Every evening | ORAL | Status: DC | PRN

## 2015-10-01 MED ORDER — SIMETHICONE 80 MG PO CHEW: 80.0000 mg | CHEWABLE_TABLET | ORAL | Status: DC | PRN

## 2015-10-01 MED ORDER — IBUPROFEN 600 MG PO TABS
600.0000 mg | ORAL_TABLET | Freq: Four times a day (QID) | ORAL | Status: DC
  Administered 2015-10-01 – 2015-10-02 (×6): 600 mg via ORAL
  Filled 2015-10-01 (×6): qty 1

## 2015-10-01 MED ORDER — DIPHENHYDRAMINE HCL 25 MG PO CAPS: 25.0000 mg | ORAL_CAPSULE | Freq: Four times a day (QID) | ORAL | Status: DC | PRN

## 2015-10-01 MED ORDER — ACETAMINOPHEN 325 MG PO TABS: 650.0000 mg | ORAL_TABLET | ORAL | Status: DC | PRN

## 2015-10-01 NOTE — Lactation Note (Signed)
This note was copied from the chart of Lynn Lawrence. Lactation Consultation Note  Experienced BF mother reports that BF is going well and that if baby is well positioned she has no pain.  Hand expression reviewed with colostrum visible. Reminded mom to have latch observed every 8 hours.  Information given on support groups and outpatient services.  Patient Name: Lynn Emilia BeckOreoluwa Mikami UVOZD'GToday's Date: 10/01/2015 Reason for consult: Initial assessment   Maternal Data Has patient been taught Hand Expression?: Yes Does the patient have breastfeeding experience prior to this delivery?: Yes  Feeding    LATCH Score/Interventions                      Lactation Tools Discussed/Used     Consult Status      Soyla DryerJoseph, Khushi Zupko 10/01/2015, 2:29 PM

## 2015-10-02 MED ORDER — SENNOSIDES-DOCUSATE SODIUM 8.6-50 MG PO TABS: 1.0000 | ORAL_TABLET | Freq: Every day | ORAL | Status: AC

## 2015-10-02 MED ORDER — ACETAMINOPHEN 325 MG PO TABS: 650.0000 mg | ORAL_TABLET | ORAL | Status: AC | PRN

## 2015-10-02 MED ORDER — IBUPROFEN 600 MG PO TABS: 600.0000 mg | ORAL_TABLET | Freq: Four times a day (QID) | ORAL | Status: AC | PRN

## 2015-10-02 NOTE — Progress Notes (Signed)
UR chart review completed.  

## 2015-10-02 NOTE — Discharge Summary (Signed)
OB Discharge Summary     Patient Name: Lynn Lawrence DOB: January 13, 1976 MRN: [id number]  Date of admission: 09/30/2015 Delivering MD: Aviva Signs   Date of discharge: 10/02/2015  Admitting diagnosis: 40 wk induction Intrauterine pregnancy: [redacted]w[redacted]d     Secondary diagnosis:  Active Problems:   AMA (advanced maternal age) multigravida 35+  Additional problems: IOL for AMA     Discharge diagnosis: Term Pregnancy Delivered                                                                                                Post partum procedures:none  Augmentation: Pitocin and Cytotec  Complications: None  Hospital course:  Induction of Labor With Vaginal Delivery   40 y.o. yo [phone] at [redacted]w[redacted]d was admitted to the hospital 09/30/2015 for induction of labor.  Indication for induction: AMA.  Patient had an uncomplicated labor course as follows: Membrane Rupture Time/Date: 11:44 PM ,09/30/2015   Intrapartum Procedures: Episiotomy: None [1]                                         Lacerations:  2nd degree [3];Periurethral [8]  Patient had delivery of a Viable infant.  Information for the patient's newborn:  Hopie, Pellegrin [[id number]]  Delivery Method: Vaginal, Spontaneous Delivery (Filed from Delivery Summary)    10/01/2015  Details of delivery can be found in separate delivery note.  Patient had a routine postpartum course. Patient is discharged home 10/02/2015.   Physical exam  Filed Vitals:   10/01/15 0754 10/01/15 1521 10/01/15 1822 10/02/15 0618  BP: 106/61 110/67 119/72 122/77  Pulse: 84 78 93 87  Temp: 98.4 F (36.9 C)  98.6 F (37 C) 98.5 F (36.9 C)  TempSrc: Oral  Oral Oral  Resp: 17  18 16   Height:      Weight:      SpO2: 100%  100%    General: alert, cooperative and no distress Uterine Fundus: firm Incision: N/A DVT Evaluation: No evidence of DVT seen on physical exam. No cords or calf tenderness. No significant calf/ankle edema. Labs: Lab Results   Component Value Date   WBC 10.5 09/30/2015   HGB 12.1 09/30/2015   HCT 36.5 09/30/2015   MCV 74.0* 09/30/2015   PLT 133* 09/30/2015   No flowsheet data found.  Discharge instruction: per After Visit Summary and "Baby and Me Booklet".  After visit meds:    Medication List    STOP taking these medications        ferrous sulfate 325 (65 FE) MG tablet      TAKE these medications        acetaminophen 325 MG tablet  Commonly known as:  TYLENOL  Take 2 tablets (650 mg total) by mouth every 4 (four) hours as needed (for pain scale < 4).     ibuprofen 600 MG tablet  Commonly known as:  ADVIL,MOTRIN  Take 1 tablet (600 mg total) by mouth every 6 (six) hours as needed.  Prenatal Vitamin 27-0.8 MG Tabs  Take 1 tablet by mouth daily.     senna-docusate 8.6-50 MG tablet  Commonly known as:  Senokot-S  Take 1 tablet by mouth at bedtime.        Diet: routine diet  Activity: Advance as tolerated. Pelvic rest for 6 weeks.   Outpatient follow up:6 weeks Follow up Appt: Future Appointments Date Time Provider Department Center  11/03/2015 9:00 AM Levie HeritageJacob J Stinson, DO [facility]   Follow up Visit:No Follow-up on file.  Postpartum contraception: Undecided  Newborn Data: Live born female  Birth Weight: 8 lb 0.2 oz (3635 g) APGAR: 8, 10  Baby Feeding: Breast Disposition:home with mother   10/02/2015 Palma HolterKanishka G Gunadasa, MD

## 2015-10-02 NOTE — Lactation Note (Signed)
This note was copied from the chart of Lynn Toinette Kipnis. Lactation Consultation Note  Patient Name: Lynn Lynn Lawrence UJWJX'BToday's Date: 10/02/2015 Reason for consult: Follow-up assessment;Hyperbilirubinemia  Baby possiblly may be D/C today with mom , pending 1300 Bili.  @ consult LC reviewed basics - and discussed jaundice and the  Effects of potentially making a baby sluggish and sleepy .  LC stressed the importance of skin to skin feedings until the baby can stay awake for the feeding .  Baby awake, and latched on easily to the right breast with assistance for depth . Fed 20 mins, with multiply swallows,  Increased with breast compressions. @ consult baby also latched onto the left breast with swallows, still feeding at the  End of the consult. MBU RN aware to chart feeding time.  Sore nipple and engorgement prevention and tx reviewed. LC instructed mom on the use of hand pump , and increased flange  for when milk comes in to #27 ( mom aware).  Mother informed of post-discharge support and given phone number to the lactation department, including services for phone call assistance; out-patient appointments; and breastfeeding support group. List of other breastfeeding resources in the community given in the handout. Encouraged mother to call for problems or concerns related to breastfeeding.   Maternal Data Has patient been taught Hand Expression?: Yes (several drops of colostrum noted )  Feeding Breast fed on right breast for 20 mins , multiply swallows ,  Increased with breast compressions.  Baby became non - nutritive , mom released suction and re-latched on the left breast with depth .  Swallows noted.   LATCH Score/Interventions Latch: Grasps breast easily, tongue down, lips flanged, rhythmical sucking.  Audible Swallowing: Spontaneous and intermittent  Type of Nipple: Everted at rest and after stimulation  Comfort (Breast/Nipple): Filling, red/small blisters or bruises,  mild/mod discomfort  Problem noted: Filling (breast warmer )  Mom assisted with latch for depth   Intervention(s): Breastfeeding basics reviewed;Support Pillows;Position options;Skin to skin   Latch score 8   Lactation Tools Discussed/Used Tools: Pump Breast pump type: Manual WIC Program: Yes (per mom [place] WIC ) Pump Review: Setup, frequency, and cleaning;Milk Storage   Consult Status Consult Status: Complete Date: 10/02/15 Follow-up type: In-patient    Kathrin Greathouseorio, Seymour Pavlak Ann 10/02/2015, 1:05 PM

## 2015-10-02 NOTE — Discharge Instructions (Signed)

## 2015-10-03 ENCOUNTER — Ambulatory Visit: Payer: Self-pay

## 2015-10-03 NOTE — Lactation Note (Signed)
This note was copied from the chart of Lynn Lawrence. Lactation Consultation Note  Patient Name: Lynn Lawrence ZOXWR'U Date: 10/03/2015 Reason for consult: Follow-up assessment   Follow up with experience BF mom. Infant with 12 BF for 10-40 minutes, 1 void and 3 stools in last 24 hours. Infant weight 7 lb 11.1 oz with a 4% weight loss since birth. Infant currently asleep in mom's arms. Mom report nipple tenderness with latch that improves with feeding. She is using Expressed Colostrum to nipples post BF. MOm reports she is much fuller today and can easily express milk. She has a hand pump for prn use at home. She is a [facility] client and plans to call and make a follow up appointment. Infant has f/u ped appt tomorrow. Reviewed all BF information in Taking Care of Baby and Me Booklet. Reviewed LC Brochure, mom aware of OP Services, BF Support Groups and phone #. Mom reports she loves to BF and knows it is best for her children.    Maternal Data Formula Feeding for Exclusion: No Does the patient have breastfeeding experience prior to this delivery?: Yes  Feeding    LATCH Score/Interventions                      Lactation Tools Discussed/Used WIC Program: Yes (mom To call and make appt) Pump Review: Milk Storage   Consult Status Consult Status: Complete Follow-up type: Call as needed    Ed Blalock 10/03/2015, 9:15 AM

## 2015-10-16 NOTE — Discharge Summary (Signed)
OB Discharge Summary     Patient Name: Lynn Lawrence DOB: 1976/07/30 MRN: [id number]  Date of admission: 09/30/2015 Delivering MD: Aviva Signs   Date of discharge: 10/16/2015  Admitting diagnosis: 40 wk induction Intrauterine pregnancy: [redacted]w[redacted]d     Secondary diagnosis:  Active Problems:   AMA (advanced maternal age) multigravida 35+   SVD (spontaneous vaginal delivery)  Additional problems: IOL for AMA     Discharge diagnosis: Term Pregnancy Delivered                                                                                                Post partum procedures:none  Augmentation: Pitocin and Cytotec  Complications: None  Hospital course:  Induction of Labor With Vaginal Delivery   40 y.o. yo [phone] at [redacted]w[redacted]d was admitted to the hospital 09/30/2015 for induction of labor.  Indication for induction: AMA.  Patient had an uncomplicated labor course as follows: Membrane Rupture Time/Date: 11:44 PM ,09/30/2015   Intrapartum Procedures: Episiotomy: None [1]                                         Lacerations:  2nd degree [3];Periurethral [8]  Patient had delivery of a Viable infant.  Information for the patient's newborn:  Tikesha, Mort [[id number]]  Delivery Method: Vaginal, Spontaneous Delivery (Filed from Delivery Summary)    10/01/2015  Details of delivery can be found in separate delivery note.  Patient had a routine postpartum course. Patient is discharged home 10/16/2015.   Physical exam  Filed Vitals:   10/01/15 0754 10/01/15 1521 10/01/15 1822 10/02/15 0618  BP: 106/61 110/67 119/72 122/77  Pulse: 84 78 93 87  Temp: 98.4 F (36.9 C)  98.6 F (37 C) 98.5 F (36.9 C)  TempSrc: Oral  Oral Oral  Resp: Height:      Weight:      SpO2: 100%  100%    General: alert, cooperative and no distress Uterine Fundus: firm Incision: N/A DVT Evaluation: No evidence of DVT seen on physical exam. No cords or calf tenderness. No  significant calf/ankle edema. Labs: Lab Results  Component Value Date   WBC 10.5 09/30/2015   HGB 12.1 09/30/2015   HCT 36.5 09/30/2015   MCV 74.0* 09/30/2015   PLT 133* 09/30/2015   No flowsheet data found.  Discharge instruction: per After Visit Summary and "Baby and Me Booklet".  After visit meds:    Medication List    STOP taking these medications        ferrous sulfate 325 (65 FE) MG tablet      TAKE these medications        acetaminophen 325 MG tablet  Commonly known as:  TYLENOL  Take 2 tablets (650 mg total) by mouth every 4 (four) hours as needed (for pain scale < 4).     ibuprofen 600 MG tablet  Commonly known as:  ADVIL,MOTRIN  Take 1 tablet (600 mg total) by mouth every  6 (six) hours as needed.     Prenatal Vitamin 27-0.8 MG Tabs  Take 1 tablet by mouth daily.     senna-docusate 8.6-50 MG tablet  Commonly known as:  Senokot-S  Take 1 tablet by mouth at bedtime.        Diet: routine diet  Activity: Advance as tolerated. Pelvic rest for 6 weeks.   Outpatient follow up:6 weeks Follow up Appt: Future Appointments Date Time Provider Department Center  11/03/2015 9:00 AM Levie Heritage, DO [facility]   Follow up Visit:No Follow-up on file.  Postpartum contraception: Undecided  Newborn Data: Live born female  Birth Weight: 8 lb 0.2 oz (3635 g) APGAR: 8, 10  Baby Feeding: Breast Disposition:home with mother   10/16/2015 Ferdie Ping, CNM

## 2015-11-03 ENCOUNTER — Encounter: Payer: Self-pay | Admitting: Family Medicine

## 2015-11-03 ENCOUNTER — Ambulatory Visit (INDEPENDENT_AMBULATORY_CARE_PROVIDER_SITE_OTHER): Payer: Medicaid Other | Admitting: Family Medicine

## 2015-11-03 NOTE — Patient Instructions (Signed)
Postpartum Depression and Baby Blues °The postpartum period begins right after the birth of a baby. During this time, there is often a great amount of joy and excitement. It is also a time of many changes in the life of the parents. Regardless of how many times a mother gives birth, each child brings new challenges and dynamics to the family. It is not unusual to have feelings of excitement along with confusing shifts in moods, emotions, and thoughts. All mothers are at risk of developing postpartum depression or the "baby blues." These mood changes can occur right after giving birth, or they may occur many months after giving birth. The baby blues or postpartum depression can be mild or severe. Additionally, postpartum depression can go away rather quickly, or it can be a long-term condition.  °CAUSES °Raised hormone levels and the rapid drop in those levels are thought to be a main cause of postpartum depression and the baby blues. A number of hormones change during and after pregnancy. Estrogen and progesterone usually decrease right after the delivery of your baby. The levels of thyroid hormone and various cortisol steroids also rapidly drop. Other factors that play a role in these mood changes include major life events and genetics.  °RISK FACTORS °If you have any of the following risks for the baby blues or postpartum depression, know what symptoms to watch out for during the postpartum period. Risk factors that may increase the likelihood of getting the baby blues or postpartum depression include: °· Having a personal or family history of depression.   °· Having depression while being pregnant.   °· Having premenstrual mood issues or mood issues related to oral contraceptives. °· Having a lot of life stress.   °· Having marital conflict.   °· Lacking a social support network.   °· Having a baby with special needs.   °· Having health problems, such as diabetes.   °SIGNS AND SYMPTOMS °Symptoms of baby blues  include: °· Brief changes in mood, such as going from extreme happiness to sadness. °· Decreased concentration.   °· Difficulty sleeping.   °· Crying spells, tearfulness.   °· Irritability.   °· Anxiety.   °Symptoms of postpartum depression typically begin within the first month after giving birth. These symptoms include: °· Difficulty sleeping or excessive sleepiness.   °· Marked weight loss.   °· Agitation.   °· Feelings of worthlessness.   °· Lack of interest in activity or food.   °Postpartum psychosis is a very serious condition and can be dangerous. Fortunately, it is rare. Displaying any of the following symptoms is cause for immediate medical attention. Symptoms of postpartum psychosis include:  °· Hallucinations and delusions.   °· Bizarre or disorganized behavior.   °· Confusion or disorientation.   °DIAGNOSIS  °A diagnosis is made by an evaluation of your symptoms. There are no medical or lab tests that lead to a diagnosis, but there are various questionnaires that a health care provider may use to identify those with the baby blues, postpartum depression, or psychosis. Often, a screening tool called the Edinburgh Postnatal Depression Scale is used to diagnose depression in the postpartum period.  °TREATMENT °The baby blues usually goes away on its own in 1-2 weeks. Social support is often all that is needed. You will be encouraged to get adequate sleep and rest. Occasionally, you may be given medicines to help you sleep.  °Postpartum depression requires treatment because it can last several months or longer if it is not treated. Treatment may include individual or group therapy, medicine, or both to address any social, physiological, and psychological   factors that may play a role in the depression. Regular exercise, a healthy diet, rest, and social support may also be strongly recommended.  °Postpartum psychosis is more serious and needs treatment right away. Hospitalization is often needed. °HOME CARE  INSTRUCTIONS °· Get as much rest as you can. Nap when the baby sleeps.   °· Exercise regularly. Some women find yoga and walking to be beneficial.   °· Eat a balanced and nourishing diet.   °· Do little things that you enjoy. Have a cup of tea, take a bubble bath, read your favorite magazine, or listen to your favorite music. °· Avoid alcohol.   °· Ask for help with household chores, cooking, grocery shopping, or running errands as needed. Do not try to do everything.   °· Talk to people close to you about how you are feeling. Get support from your partner, family members, friends, or other new moms. °· Try to stay positive in how you think. Think about the things you are grateful for.   °· Do not spend a lot of time alone.   °· Only take over-the-counter or prescription medicine as directed by your health care provider. °· Keep all your postpartum appointments.   °· Let your health care provider know if you have any concerns.   °SEEK MEDICAL CARE IF: °You are having a reaction to or problems with your medicine. °SEEK IMMEDIATE MEDICAL CARE IF: °· You have suicidal feelings.   °· You think you may harm the baby or someone else. °MAKE SURE YOU: °· Understand these instructions. °· Will watch your condition. °· Will get help right away if you are not doing well or get worse. °  °This information is not intended to replace advice given to you by your health care provider. Make sure you discuss any questions you have with your health care provider. °  °Document Released: 06/06/2004 Document Revised: 09/07/2013 Document Reviewed: 06/14/2013 °Elsevier Interactive Patient Education ©2016 [employer] ° °

## 2015-11-03 NOTE — Progress Notes (Signed)
Patient ID: Lynn Lawrence, female   DOB: 1976-07-22, 40 y.o.   MRN: [id number] Subjective:     Lynn Lawrence is a 40 y.o. female who presents for a postpartum visit. She is  postpartum following a vaginal delivery on 10/01/2015 at 40 weeks and 1 day. I have fully reviewed the prenatal and intrapartum course.   Anesthesia was epidual. Baby is feeding by Breast Feeding. Bowel function is normal . Bladder function is normal.  Patient is not sexually active. Contraception method is not using anything at the moment. Postpartum depression screening negative.    Review of Systems    Objective:    BP 107/70 mmHg  Pulse 76  Temp(Src) 98.1 F (36.7 C)  Wt 164 lb 8 oz (74.617 kg)  General:  alert, cooperative and no distress  Lungs: clear to auscultation bilaterally  Heart:  regular rate and rhythm, S1, S2 normal, no murmur, click, rub or gallop  Abdomen: soft, non-tender; bowel sounds normal; no masses,  no organomegaly   Vulva:  not evaluated  Vagina: not evaluated  Rectal Exam: Not performed.        Assessment:     Normal postpartum exam. Pap smear not done at today's visit.   Plan:    1. Contraception: none at this point.  Patient counseled to call if she wants contraception. 3. Follow up in: or as needed.

## 2015-11-08 ENCOUNTER — Ambulatory Visit: Payer: Medicaid Other | Attending: Optometrist | Admitting: Occupational Therapy

## 2015-11-08 DIAGNOSIS — H53419 Scotoma involving central area, unspecified eye: Secondary | ICD-10-CM | POA: Diagnosis present
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
## 2016-08-21 ENCOUNTER — Other Ambulatory Visit: Payer: Self-pay | Admitting: Infectious Disease

## 2016-08-21 ENCOUNTER — Ambulatory Visit
Admission: RE | Admit: 2016-08-21 | Discharge: 2016-08-21 | Disposition: A | Discharge status: Home / Self Care | Payer: Medicaid Other | Source: Ambulatory Visit | Attending: Infectious Disease | Admitting: Infectious Disease

## 2016-08-21 DIAGNOSIS — R7611 Nonspecific reaction to tuberculin skin test without active tuberculosis: Secondary | ICD-10-CM

## 2017-01-14 IMAGING — US US MFM OB FOLLOW-UP
1 series · 14 of 28 positions shown · non-contrast
Comparison: none

[Series 1: us mfm ob follow-up · 53 acquisitions, 14 frames shown]
[im 2/53]
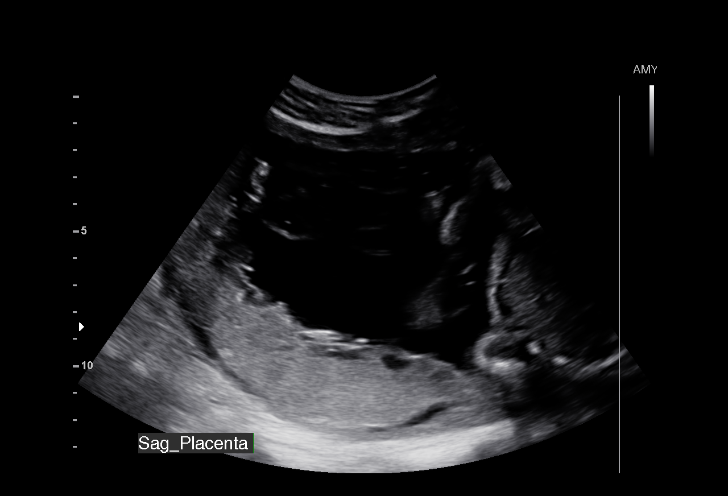
[im 6/53]
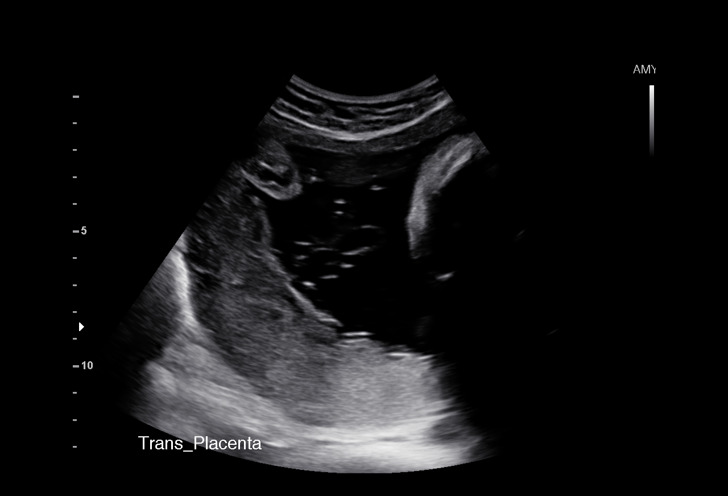
[im 10/53]
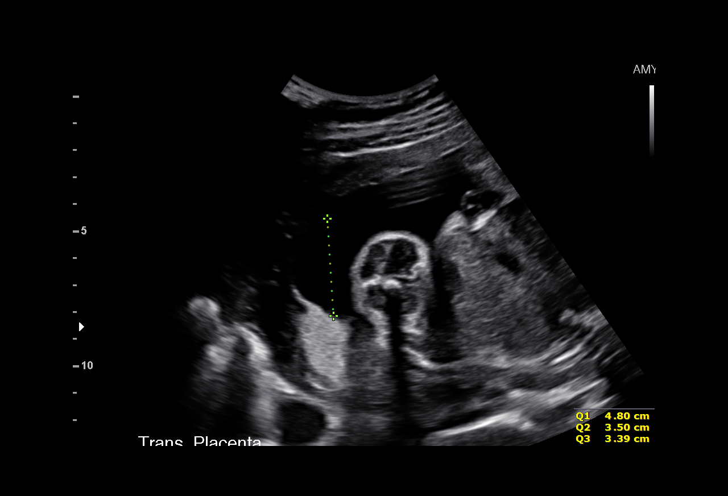
[im 14/53]
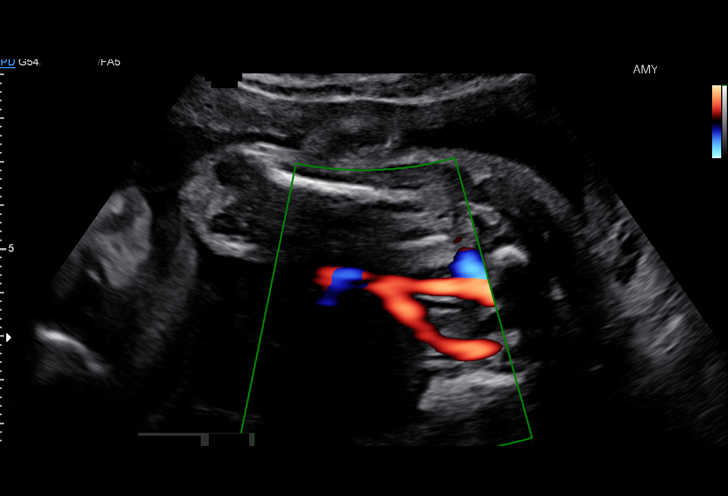
[im 18/53]
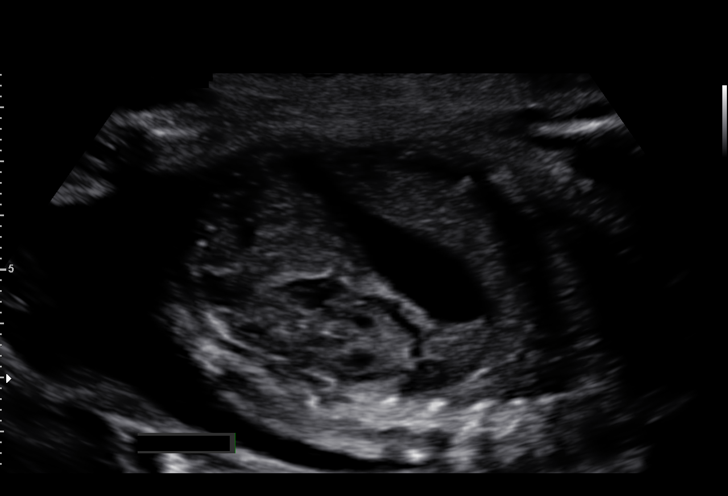
[im 22/53]
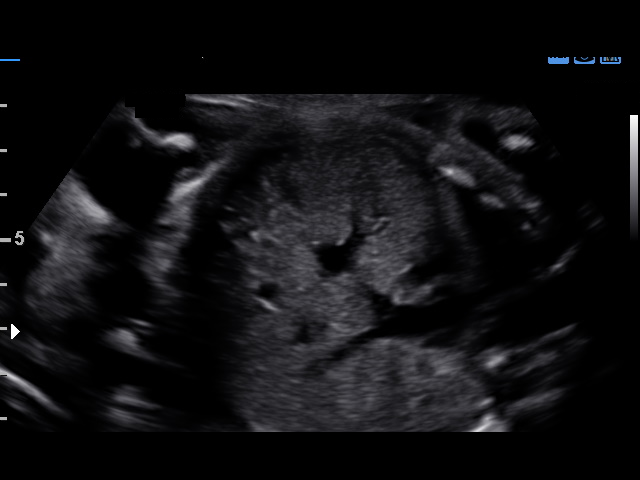
[im 26/53]
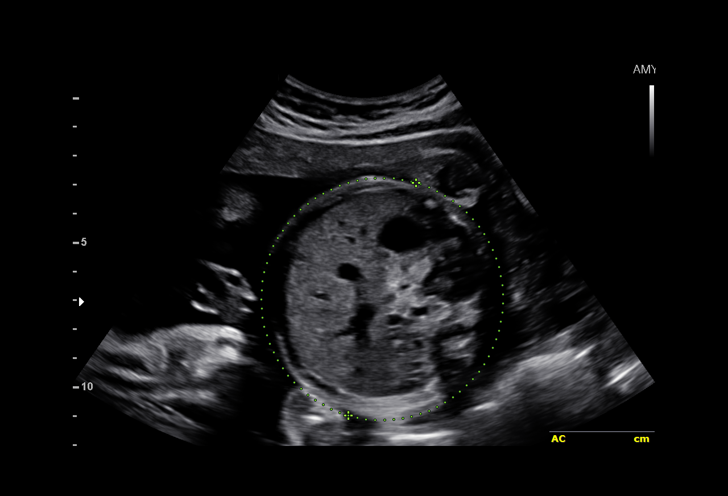
[im 29/53]
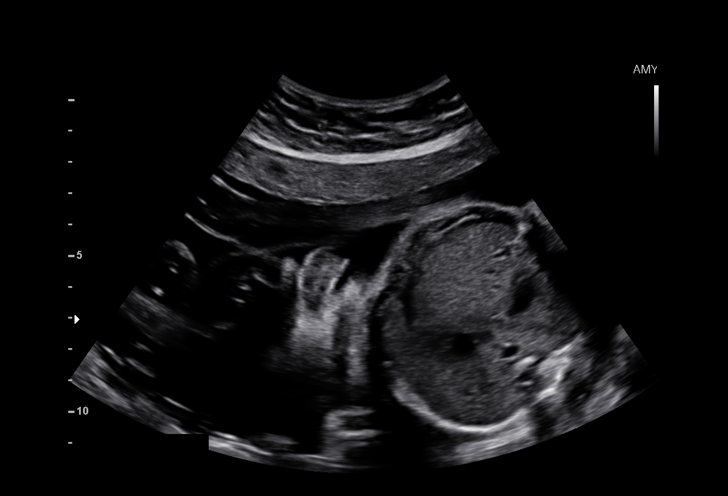
[im 33/53]
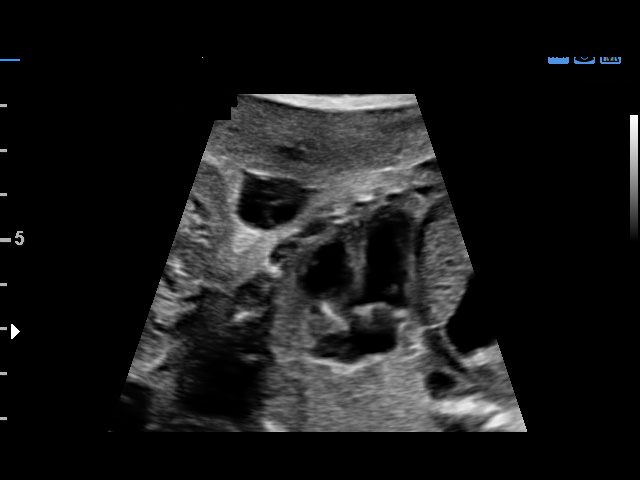
[im 37/53]
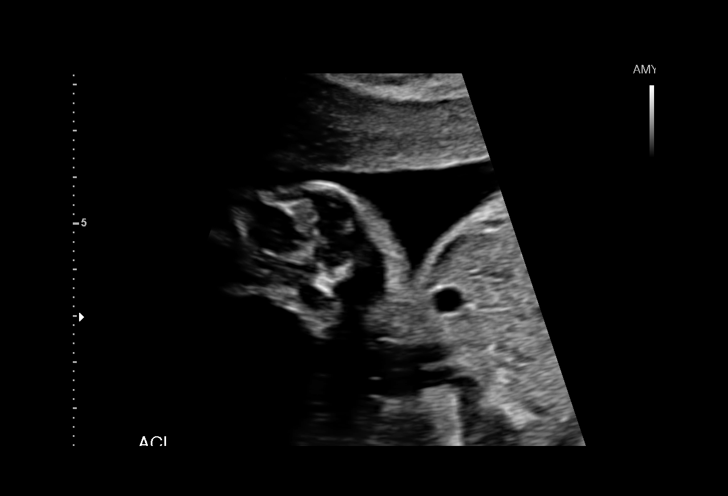
[im 41/53]
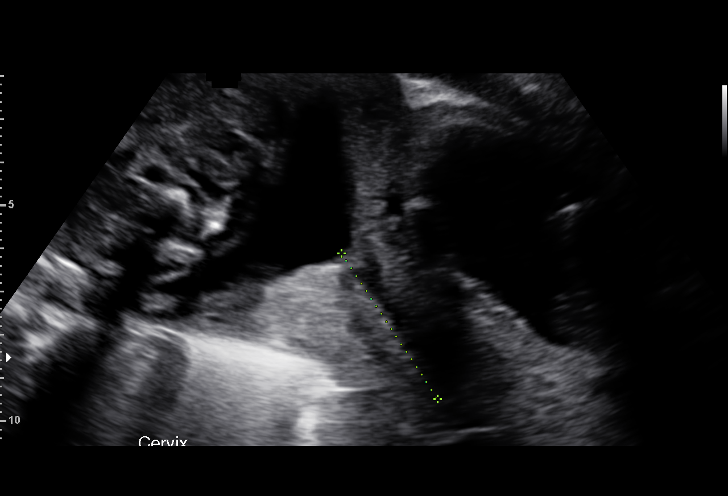
[im 45/53]
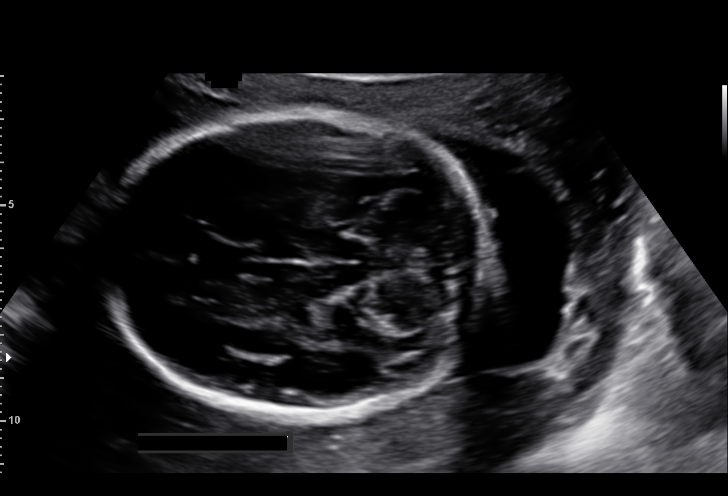
[im 49/53]
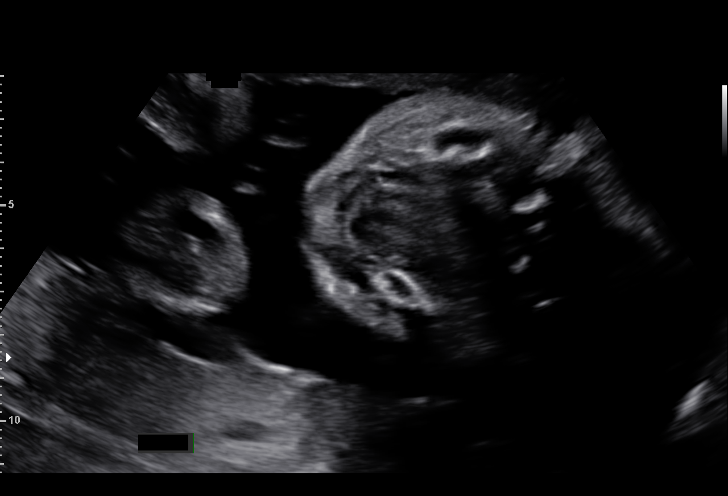
[im 53/53]
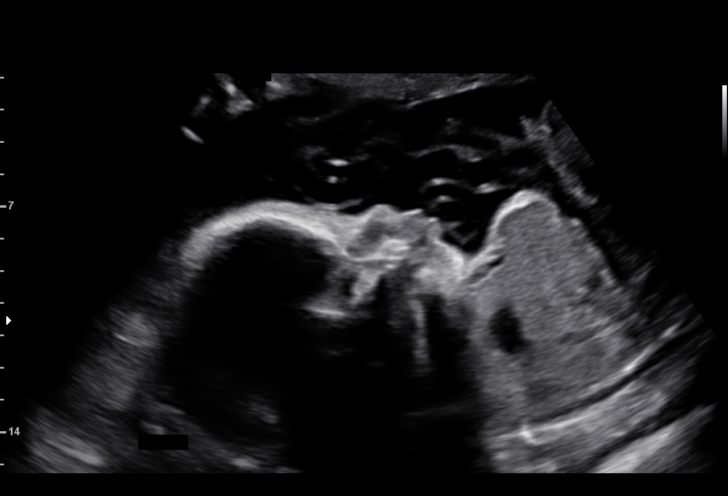

[14 of 28 positions shown; findings below may reference images not displayed]

OBSTETRICS REPORT
(Signed Final 07/13/2015 [DATE])

Faculty Physician
Service(s) Provided

Indications

Advanced maternal age multigravida (39) second
trimester; low risk quad screen
28 weeks gestation of pregnancy
Fetal Evaluation

Num Of Fetuses:    1
Fetal Heart Rate:  142                          bpm
Cardiac Activity:  Observed
Presentation:      Breech
Placenta:          Posterior, above cervical
os
P. Cord            Visualized
Insertion:

Amniotic Fluid
AFI FV:      Subjectively within normal limits
AFI Sum:     15.3     cm      54  %Tile      Larg Pckt:    4.8  cm
RUQ:   4.8     cm   RLQ:    3.61   cm    LUQ:    3.5    cm    LLQ:   3.39    cm
Biometry

BPD:     71.6   mm    G. Age:  28w 5d                CI:        68.01    70 - 86
FL/HC:       18.3   19.6 -
20.8
HC:     277.7   mm    G. Age:  30w 3d       68   %   HC/AC:       1.06   0.99 -
1.21
AC:     262.9   mm    G. Age:  30w 3d       88   %   FL/BPD:      71.1   71 - 87
FL:      50.9   mm    G. Age:  27w 2d         7  %   FL/AC:       19.4   20 - 24
HUM:     47.7   mm    G. Age:  28w 0d       31   %

Est. FW:    7176   gm          3 lb     62  %
Gestational Age

LMP:           26w 5d        Date:  01/07/15                 EDD:    10/14/15
U/S Today:     29w 2d                                        EDD:    09/26/15
Best:          28w 5d     Det. By:  U/S (04/19/15)           EDD:    09/30/15
Anatomy

Cranium:          Appears normal         Aortic Arch:       Previously seen
Fetal Cavum:      Appears normal         Ductal Arch:       Previously seen
Ventricles:       Appears normal         Diaphragm:         Appears normal
Choroid Plexus:   Previously seen        Stomach:           Appears normal, left
sided
Cerebellum:       Appears normal         Abdomen:           Appears normal
Posterior Fossa:  Appears normal         Abdominal Wall:    Appears nml (cord
insert, abd wall)
Nuchal Fold:      Previously seen        Cord Vessels:      Appears normal (3
vessel cord)
Face:             Appears normal         Kidneys:           Appear normal
(orbits and profile)
Lips:             Appears normal         Bladder:           Appears normal
Palate:           Appears normal         Spine:             Previously seen
Heart:            Appears normal         Lower              Previously seen
(4CH, axis, and        Extremities:
situs)
RVOT:             Appears normal         Upper              Previously seen
Extremities:
LVOT:             Appears normal

Other:  Male gender. Heels and 5th digit previously seen.
Cervix Uterus Adnexa

Cervical Length:    4.04      cm

Cervix:       Normal appearance by transabdominal scan. Appears
closed, without funnelling.
Impression

SIUP at 28+5 weeks
Normal interval anatomy; anatomic survey complete
Normal amniotic fluid volume
Appropriate interval growth with EFW at the 62nd %tile
Recommendations

Follow-up as clinically indicated

## 2018-06-09 ENCOUNTER — Telehealth: Payer: Self-pay | Admitting: Student in an Organized Health Care Education/Training Program

## 2018-06-09 NOTE — Telephone Encounter (Signed)
Pt answered phone and was asked about coming in for a health maintenance check, but stated issues with money as cause for not coming in; briefly discussed with pt that they need to have an appointment soon to maintain active status.

## 2019-11-20 ENCOUNTER — Ambulatory Visit: Payer: Self-pay | Attending: Internal Medicine

## 2019-11-20 DIAGNOSIS — Z23 Encounter for immunization: Secondary | ICD-10-CM | POA: Insufficient documentation

## 2019-11-20 NOTE — Progress Notes (Signed)
   Covid-19 Vaccination Clinic  Name:  Lynn Lawrence    MRN: [id number] DOB: 11/26/1975  11/20/2019  [name] was observed post Covid-19 immunization for 15 minutes without incident. She was provided with Vaccine Information Sheet and instruction to access the V-Safe system.   [name] was instructed to call 911 with any severe reactions post vaccine: Marland Kitchen Difficulty breathing  . Swelling of face and throat  . A fast heartbeat  . A bad rash all over body  . Dizziness and weakness   Immunizations Administered    Name Date Dose VIS Date Route   Pfizer COVID-19 Vaccine 11/20/2019 10:37 AM 0.3 mL 08/27/2019 Intramuscular   Manufacturer: [employer], [employer]   Lot: [id number]   NDC: [address]-7

## 2019-12-11 ENCOUNTER — Ambulatory Visit: Payer: Self-pay | Attending: Internal Medicine

## 2019-12-11 DIAGNOSIS — Z23 Encounter for immunization: Secondary | ICD-10-CM

## 2019-12-11 NOTE — Progress Notes (Signed)
   Covid-19 Vaccination Clinic  Name:  Elverna Caffee    MRN: [id number] DOB: [birth year]  12/11/2019  Ms. Woehrle was observed post Covid-19 immunization for 15 minutes without incident. She was provided with Vaccine Information Sheet and instruction to access the V-Safe system.   Ms. Zwicker was instructed to call 911 with any severe reactions post vaccine: Marland Kitchen Difficulty breathing  . Swelling of face and throat  . A fast heartbeat  . A bad rash all over body  . Dizziness and weakness   Immunizations Administered    Name Date Dose VIS Date Route   Pfizer COVID-19 Vaccine 12/11/2019  8:27 AM 0.3 mL 08/27/2019 Intramuscular   Manufacturer: [employer], [employer]   Lot: [id number]   NDC: [address]-6

## 2022-11-11 ENCOUNTER — Emergency Department (HOSPITAL_COMMUNITY)
Admission: EM | Admit: 2022-11-11 | Discharge: 2022-11-11 | Disposition: A | Discharge status: Home / Self Care | Payer: BLUE CROSS/BLUE SHIELD | Attending: Emergency Medicine | Admitting: Emergency Medicine

## 2022-11-11 DIAGNOSIS — S161XXA Strain of muscle, fascia and tendon at neck level, initial encounter: Secondary | ICD-10-CM | POA: Insufficient documentation

## 2022-11-11 DIAGNOSIS — Y92410 Unspecified street and highway as the place of occurrence of the external cause: Secondary | ICD-10-CM | POA: Diagnosis not present

## 2022-11-11 DIAGNOSIS — S199XXA Unspecified injury of neck, initial encounter: Secondary | ICD-10-CM | POA: Diagnosis present
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
